# Patient Record
Sex: Male | Born: 1935 | Race: White | Hispanic: No | Marital: Married | Smoking: Former smoker
Health system: Southern US, Community
[De-identification: ages and names within clinical notes are randomized; demographics above are authoritative.]

## PROBLEM LIST (undated history)

## (undated) DIAGNOSIS — I2510 Atherosclerotic heart disease of native coronary artery without angina pectoris: Secondary | ICD-10-CM

## (undated) DIAGNOSIS — E785 Hyperlipidemia, unspecified: Secondary | ICD-10-CM

## (undated) DIAGNOSIS — K219 Gastro-esophageal reflux disease without esophagitis: Secondary | ICD-10-CM

## (undated) DIAGNOSIS — M545 Low back pain, unspecified: Secondary | ICD-10-CM

## (undated) DIAGNOSIS — R0600 Dyspnea, unspecified: Secondary | ICD-10-CM

## (undated) DIAGNOSIS — I10 Essential (primary) hypertension: Secondary | ICD-10-CM

## (undated) DIAGNOSIS — G4730 Sleep apnea, unspecified: Secondary | ICD-10-CM

## (undated) DIAGNOSIS — M1990 Unspecified osteoarthritis, unspecified site: Secondary | ICD-10-CM

## (undated) DIAGNOSIS — N400 Enlarged prostate without lower urinary tract symptoms: Secondary | ICD-10-CM

## (undated) HISTORY — DX: Benign prostatic hyperplasia without lower urinary tract symptoms: N40.0

## (undated) HISTORY — PX: APPENDECTOMY: SHX54

## (undated) HISTORY — PX: CARDIAC CATHETERIZATION: SHX172

## (undated) HISTORY — DX: Hyperlipidemia, unspecified: E78.5

## (undated) HISTORY — PX: CERVICAL FUSION: SHX112

## (undated) HISTORY — PX: TOTAL KNEE ARTHROPLASTY: SHX125

## (undated) HISTORY — PX: OTHER SURGICAL HISTORY: SHX169

## (undated) HISTORY — DX: Low back pain, unspecified: M54.50

## (undated) HISTORY — DX: Gastro-esophageal reflux disease without esophagitis: K21.9

## (undated) HISTORY — PX: TONSILLECTOMY: SUR1361

## (undated) HISTORY — PX: COLONOSCOPY: SHX174

## (undated) HISTORY — PX: NECK SURGERY: SHX720

## (undated) HISTORY — PX: EYE SURGERY: SHX253

---

## 1991-03-03 HISTORY — PX: BACK SURGERY: SHX140

## 2000-12-13 ENCOUNTER — Encounter: Payer: Self-pay | Admitting: Neurosurgery

## 2000-12-13 ENCOUNTER — Ambulatory Visit (HOSPITAL_COMMUNITY): Admission: RE | Admit: 2000-12-13 | Discharge: 2000-12-13 | Payer: Self-pay | Admitting: Neurosurgery

## 2001-02-10 ENCOUNTER — Encounter: Payer: Self-pay | Admitting: Neurosurgery

## 2001-02-10 ENCOUNTER — Ambulatory Visit (HOSPITAL_COMMUNITY): Admission: RE | Admit: 2001-02-10 | Discharge: 2001-02-10 | Payer: Self-pay | Admitting: Neurosurgery

## 2002-01-02 ENCOUNTER — Emergency Department (HOSPITAL_COMMUNITY): Admission: EM | Admit: 2002-01-02 | Discharge: 2002-01-02 | Payer: Self-pay | Admitting: Emergency Medicine

## 2004-06-19 ENCOUNTER — Ambulatory Visit (HOSPITAL_COMMUNITY): Admission: RE | Admit: 2004-06-19 | Discharge: 2004-06-19 | Payer: Self-pay | Admitting: Orthopedic Surgery

## 2004-06-25 ENCOUNTER — Ambulatory Visit: Payer: Self-pay | Admitting: Cardiology

## 2004-06-30 ENCOUNTER — Ambulatory Visit: Payer: Self-pay

## 2004-07-09 ENCOUNTER — Inpatient Hospital Stay (HOSPITAL_COMMUNITY): Admission: RE | Admit: 2004-07-09 | Discharge: 2004-07-13 | Payer: Self-pay | Admitting: Orthopedic Surgery

## 2004-07-23 ENCOUNTER — Emergency Department (HOSPITAL_COMMUNITY): Admission: EM | Admit: 2004-07-23 | Discharge: 2004-07-24 | Payer: Self-pay | Admitting: Emergency Medicine

## 2005-02-12 ENCOUNTER — Ambulatory Visit: Payer: Self-pay | Admitting: *Deleted

## 2005-04-08 ENCOUNTER — Inpatient Hospital Stay (HOSPITAL_COMMUNITY): Admission: RE | Admit: 2005-04-08 | Discharge: 2005-04-11 | Payer: Self-pay | Admitting: Orthopedic Surgery

## 2006-01-20 ENCOUNTER — Emergency Department (HOSPITAL_COMMUNITY): Admission: EM | Admit: 2006-01-20 | Discharge: 2006-01-20 | Payer: Self-pay | Admitting: Emergency Medicine

## 2008-11-10 ENCOUNTER — Encounter: Admission: RE | Admit: 2008-11-10 | Discharge: 2008-11-10 | Payer: Self-pay | Admitting: Neurosurgery

## 2008-12-07 ENCOUNTER — Inpatient Hospital Stay (HOSPITAL_COMMUNITY): Admission: RE | Admit: 2008-12-07 | Discharge: 2008-12-08 | Payer: Self-pay | Admitting: Neurosurgery

## 2010-06-05 LAB — CBC
HCT: 42.8 % (ref 39.0–52.0)
MCHC: 33.9 g/dL (ref 30.0–36.0)
MCV: 94.1 fL (ref 78.0–100.0)
Platelets: 184 10*3/uL (ref 150–400)
RDW: 13.6 % (ref 11.5–15.5)
WBC: 5 10*3/uL (ref 4.0–10.5)

## 2010-06-05 LAB — BASIC METABOLIC PANEL
BUN: 19 mg/dL (ref 6–23)
CO2: 31 mEq/L (ref 19–32)
Chloride: 103 mEq/L (ref 96–112)
Glucose, Bld: 98 mg/dL (ref 70–99)
Potassium: 3.8 mEq/L (ref 3.5–5.1)

## 2010-07-18 NOTE — Op Note (Signed)
Justin Coleman, Justin Coleman              ACCOUNT NO.:  [id number]   MEDICAL RECORD NO.:  [id number]          PATIENT TYPE:  INP   LOCATION:  2550                         FACILITY:  [facility]   PHYSICIAN:  Justin Coleman, M.D. DATE OF BIRTH:  07-08-1932   DATE OF PROCEDURE:  07/09/2004  DATE OF DISCHARGE:                                 OPERATIVE REPORT   PREOPERATIVE DIAGNOSIS:  Right knee DJD.   POSTOPERATIVE DIAGNOSIS:  Same.   PROCEDURE:  Right total knee replacement using DePuy cemented total knee  system with #5 cemented femur, #5 cemented tibia with 10 mm polyethylene RP  tibial spacer and 32 mm polyethylene cemented patella.   SURGEON:  Justin Coleman.   M.D.ASSISTANT:  Justin Coleman, P.A.   ANESTHESIA:  General.   OPERATIVE TIME:  1 hour 25 minutes.   COMPLICATIONS:  None.   DESCRIPTION OF PROCEDURE:  [name] was brought to operating room on  07/09/2004 after a femoral nerve block had been placed in the holding room  by anesthesia. He is placed on the operative table in supine position. After  being placed under general anesthesia, he received Ancef 1 gram IV  preoperatively for prophylaxis.  He had a Foley catheter placed under  sterile conditions. His right knee was examined under anesthesia. Range of  motion from -10 to 125 degrees with mild varus deformity.  Knee stable to  ligamentous exam with normal patellar tracking. Right leg was prepped using  sterile DuraPrep and draped using sterile technique. Leg was exsanguinated  and a thigh tourniquet elevated 375 mm. Initially, through a 15 cm  longitudinal incision based over the patella, initial exposure was made.  The underlying subcutaneous tissues were incised in line with the skin  incision. A median arthrotomy was performed revealing an excessive amount of  normal-appearing joint fluid. The articular surfaces were inspected. He had  grade 4 changes medially, grade 3 changes laterally, and grade 3 and 4  changes in the patellofemoral joint. Osteophytes were removed from the  femoral condyles and tibial plateau. The medial and lateral meniscal  remnants were removed as well as the anterior cruciate ligament. An  intramedullary drill was then drilled up the femoral canal for placement of  the distal femoral cutting jig which was placed in the appropriate amount of  rotation and the distal 11 mm cut was made. The distal femur was then sized.  A #5 was found be the appropriate size. The #5 cutting jig was placed and  then these cuts were made. After this was done the proximal tibia was  exposed. The tibial spines were removed with an oscillating saw.  Intramedullary drill was then drilled down the tibial canal for placement of  proximal tibial cutting jig which was placed in the appropriate amount of  rotation.  Then the proximal 6 mm cut was made based off the medial or lower  side. After this was done, then the spacer blocks were placed both in  flexion and extension and with 10 mm blocks and this was found to give  excellent balance. The PCL cutter was  then placed on the distal femur and  then this cut was made. After this was done, then the #5 tibial base plate  was placed and the keel cut was made. At this point the femoral trial was  placed. The tibial base plate trial was placed and with a 10 mm polyethylene  RP tibial spacer, there was found to be excellent restoration of normal  alignment, excellent stability, range of motion from 0 to 125 degrees. The  patella was then sized. A 32 mm was found to be the appropriate size. A  resurfacing cut, resecting 10 mm of patella was obtained with a cutting jig  and then three locking holes were placed. The patellar trial was then placed  and the patellofemoral tracking was evaluated. This was found to be normal.  At this point was felt that all the trial components were of excellent size  and stability. They were then removed.  The knee was then  jet lavage  irrigated with 3 liters of saline solution. The proximal tibia was exposed  and the #5 tibial baseplate with cement backing was hammered into position  with an excellent fit with excess cement being removed from around the  edges. The #5 femoral component with cement backing was hammered into  position also with an excellent fit with excess cement being removed from  around the edges. A 10 mm polyethylene RP tibial spacer was then placed on  the tibial base plate. The knee taken through range of motion from 0 to 120  degrees with excellent stability, excellent correction of his varus  deformity. The 32 mm resurfacing patella with cement backing was then placed  into its three locking holes, and held there with a clamp. After the cement  hardened. Patellofemoral tracking was evaluated. This was found be normal.  At this point felt that all components were of excellent size and stability.  The knee was further irrigated with saline and then the arthrotomy was  closed with #1 Ethibond suture over two medium Hemovac drains. Subcutaneous  tissues closed with 0 and 2-0 Vicryl.  Prior to this, the tourniquet was  released and hemostasis was obtained with cautery. The subcutaneous tissues  were then closed with 2-0 Vicryl and subcuticular layer closed with 3-0  Monocryl. Steri-Strips were applied. Sterile dressings were applied. A  Hemovac injected with 0.25%  Marcaine with epinephrine, 4 milligrams  morphine and clamped and then a long-leg splint applied. The patient  awakened, extubated, taken to recovery in stable condition. Needle and  sponge count was correct x 2 at the end of the case.       RAW/MEDQ  D:  07/09/2004  T:  07/09/2004  Job:  [id number]

## 2010-07-18 NOTE — Discharge Summary (Signed)
Justin Coleman, Justin Coleman              ACCOUNT NO.:  [id number]   MEDICAL RECORD NO.:  [id number]          PATIENT TYPE:  INP   LOCATION:  5038                         FACILITY:  [facility]   PHYSICIAN:  Robert A. Thurston Hole, M.D. DATE OF BIRTH:  1932/08/14   DATE OF ADMISSION:  04/08/2005  DATE OF DISCHARGE:  04/11/2005                                 DISCHARGE SUMMARY   ADMISSION DIAGNOSES:  1.  End-stage degenerative joint disease, left knee.  2.  Hypertension.  3.  Benign prostatic hypertrophy.   DISCHARGE DIAGNOSES:  1.  End-stage degenerative joint disease, left knee.  Status post total knee      replacement.  2.  Hypertension.  3.  Benign prostatic hypertrophy.   HISTORY OF PRESENT ILLNESS:  The patient is a 76 year old white male with a  history of end-stage DJD of his left knee.  He is one-year status post to  his right total knee replacement, and did very well with this.  He now has  pain in his left knee with walking and with any activity; that has failed  conservative care including anti-inflammatories, cortisone injection and  rest.  He understands the risks, benefits and possible complications of the  left total knee replacement and is without questions.   </   HOSPITAL COURSE:  On April 08, 2005 the patient underwent a left total  knee replacement by Dr. Thurston Hole.  He tolerated the procedure well.  Postoperatively he received a femoral nerve block, which he also tolerated  well.  He was admitted for pain control, DVT prophylaxis and physical  therapy.   On postoperative day 1 the patient continued to do well without complaint.  Hemoglobin was 12.2.  He was slightly hypokalemic and hyponatremic, with a  potassium of 3.2 and sodium of 133.  He was metabolically stable otherwise.  His Foley catheter was discontinued.  His PCA was discontinued.  His IV was  HepLok'd.  He was given Milk of Magnesia.  His dressing was changed and  given thigh-high TED hose.   On postoperative  day #2 the patient continued to do well.  He did have a  slight productive cough, but continues to progress well with physical  therapy.  The temperature max was 101.  Temperature on examination was 97.3,  pulse 100, blood pressure 136/71.  The surgical wound is well approximated.  His lungs are clear.  His calf is soft and he is neurovascularly intact.  His slight fever is most likely due to atelectasis. We encouraged incentive  spirometry; but, if it spikes again will obtain blood cultures, UA, culture  and sensitivity and a chest x-ray.   Discharge planning was made for the next day, postoperative day #3.  The  patient had significant difficulty with diarrhea.  Temperature max was 99.3.  Hemoglobin was 10.1.  The surgical wound was well approximated.  He was  discharged to home in stable condition.  Weightbearing as tolerated.  On a  regular diet.   DISCHARGE MEDICATIONS:  1.  Hydrochlorothiazide 50 mg 1/2 tablet daily.  2.  FloMax 0.4 mg at night.  3.  Hydrocodone 1-2 tablets q.4 h. as needed for pain.  4.  Robaxin 500 mg one q.6 h. p.r.n. muscle spasm.  5.  Lovenox 30 mg subcutaneous.  6.  Coumadin per pharmacy protocol.  7.  Colace 100 mg twice daily as needed for constipation.   SPECIAL INSTRUCTIONS:  His next INR is scheduled for Monday, April 13, 2005.  He will follow-up with Dr. Thurston Hole on April 21, 2005.  He has been  instructed to call with increased pain, increased redness, increased  swelling or temperature greater than 101.      Kirstin Shepperson, P.A.      Robert A. Thurston Hole, M.D.  Electronically Signed    KS/MEDQ  D:  06/19/2005  T:  06/22/2005  Job:  [id number]

## 2010-07-18 NOTE — Discharge Summary (Signed)
Justin Coleman, Justin Coleman              ACCOUNT NO.:  [id number]   MEDICAL RECORD NO.:  [id number]          PATIENT TYPE:  INP   LOCATION:  5016                         FACILITY:  [facility]   PHYSICIAN:  Elana Alm. Thurston Hole, M.D. DATE OF BIRTH:  March 28, 1932   DATE OF ADMISSION:  07/09/2004  DATE OF DISCHARGE:  07/13/2004                                 DISCHARGE SUMMARY   ADMISSION DIAGNOSES:  1.  End-stage degenerative joint disease right knee.  2.  Hypertension.   DISCHARGE DIAGNOSES:  1.  End-stage degenerative joint disease right knee.  2.  Hypertension.  3.  Obesity.  4.  Gastroesophageal reflux disease.  5.  Hypokalemia.   HISTORY OF PRESENT ILLNESS:  The patient is a 77 year old white male with a  history of end-stage DJD of his right knee.  He has failed conservative care  including antiinflammatories and physical therapy.  He understands risks,  benefits and possible complications of a right total knee replacement and is  without questions.   HOSPITAL COURSE:  On Jul 09, 2004 the patient underwent right total knee  replacement by Dr. Thurston Hole and a femoral nerve block by anesthesia.  He  tolerated the procedure well.  Postoperatively he was admitted for pain  control, DVT prophylaxis and physical therapy.  Postoperative day one the  patient was doing well.  He had some difficulty with pain.  T-max of 100.6,  hemoglobin 12.6, INR 1.1.  His surgical wound was well-approximated.  His  PCA was discontinued.  His dressing was changed.  TED hose were placed  following the dressing change.  The patient ambulated five feet with  physical therapy.  Postoperative day the patient had difficulty with nausea.  His hemoglobin was 11.2.  He did have positive bowel sounds.  His abdomen  was slightly distended and nontender.  His surgical wound was well-  approximated.  He was given a Dulcolax suppository which did have some  results.  Postoperative day three the patient had difficulty with  nausea.  He was afebrile.  His hemoglobin was 10.1.  Abdomen was soft, nontender and  did have positive bowel sounds.  Postoperative day four the patient was  significantly improved.  Range of motion -5-60 degrees.  He did stairs with  physical therapy.   DISPOSITION:  He was discharged to home in stable condition.  Weightbearing  as tolerated.  On a regular diet.   DISCHARGE MEDICATIONS:  1.  Percocet 5/325 1-2 q.4-6h. p.r.n. pain.  2.  Robaxin 500 mg one q.6-8h. p.r.n. muscle spasm.  3.  Reglan 10 mg one tablet every eight hours.  4.  Coumadin 5 mg daily.  5.  Aspirin 81 mg daily.  6.  Hydrochlorothiazide 25 mg daily.  7.  Flomax 0.4 mg daily.  8.  Do not take Celebrex.  9.  Colace 100 mg one tablet twice a day.  10. Senokot-S-S two tablets before dinner.   DISPOSITION:  CPM 0-90 degrees eight hours a day.  Elevate his right heel on  a folded pillow 30 minutes every morning.  Call with increased redness,  increased drainage, increased  swelling or temperature greater than 101.  Followup appointment is August 01, 2004 with Dr. Thurston Hole.       KS/MEDQ  D:  09/22/2004  T:  09/22/2004  Job:  [id number]

## 2010-07-18 NOTE — Op Note (Signed)
Justin Coleman, Justin Coleman              ACCOUNT NO.:  [id number]   MEDICAL RECORD NO.:  [id number]          PATIENT TYPE:  INP   LOCATION:  2550                         FACILITY:  [facility]   PHYSICIAN:  Robert A. Thurston Hole, M.D. DATE OF BIRTH:  05-20-1932   DATE OF PROCEDURE:  04/08/2005  DATE OF DISCHARGE:                                 OPERATIVE REPORT   PREOPERATIVE DIAGNOSIS:  Left knee degenerative joint disease.   POSTOPERATIVE DIAGNOSIS:  Left knee degenerative joint disease.   PROCEDURE:  Left total knee replacement using DePuy cemented total knee  system with #4 cemented femur, #5 cemented tibia with 15-mm polyethylene RP  tibial spacer, and a 38-mm polyethylene cemented patella.   SURGEON:  Elana Alm. Thurston Hole, M.D.   ASSISTANT:  Julien Girt, P.A.   ANESTHESIA:  General.   OPERATIVE TIME:  1 hour 15 minutes.   COMPLICATIONS:  None.   DESCRIPTION OF PROCEDURE:  Mr. Cruey was brought to the operating room on  04/08/2005 after a femoral nerve block was placed in the holding area by  anesthesia. He was placed on the operative table in the supine position. He  received Ancef 2 grams IV preoperatively for prophylaxis. After being placed  under general anesthesia, his left knee was examined. He had range of motion  from minus 5 to 125-degrees with mild varus deformity.  Knee stable  ligamentous exam with normal patellar tracking. He had a Foley catheter  placed under sterile conditions. His left leg was then prepped using sterile  DuraPrep and draped using sterile technique. Leg was exsanguinated and a  thigh tourniquet elevated to 365 mm. Initially through a 15-cm longitudinal  incision, based over the patella, initial exposure was made. The underlying  subcutaneous tissues were incised along with the skin incision. A median  arthrotomy was performed revealing an excessive amount of normal-appearing  joint fluid. The articular surfaces were inspected. He had grade 4  changes  medially, grade 3 changes laterally, and grade 3 and 4 changes in the  patellofemoral joint.   Medial and lateral meniscal remnants were removed as well as osteophytes off  the femoral condyles and tibial plateau. An intramedullary drill was then  drilled up the femoral canal for placement of the distal femoral cutting jig  which was placed in the appropriate amount of rotation and the distal 11-mm  cut was made. The distal femur was incised; #4 was found to be the  appropriate size. A #4 cutting jig was placed, and then these cuts were  made. The proximal tibia was then exposed. The tibial spines were removed  with an oscillating saw. Intramedullary drill was drilled down the tibial  canal for placement of the proximal tibial cutting jig which was placed in  the appropriate amount of rotation; and an 8-mm proximal cut was made based  off the medial or lower side.   Spacer blocks were then placed in flexion and extension; 15-mm blocks gave  excellent balancing in flexion and excellent balancing; and stability in  both flexion and extension; and excellent correction of his flexion and  varus deformities. The  proximal tibia was then exposed and a #5 tibial base  plate trial was placed and the keel cut was made. The PCL box cutter was  then placed on the distal femur and these cuts were made. At this point the  #4 femoral trial was placed with the #5 tibial base plate trial, and a 15-mm  polyethylene RP tibial spacer. The knee was reduced, taken through range of  motion 0-125 degrees with excellent stability and excellent correction of  his flexion and varus deformities. The patella was incised. A resurfacing 10-  mm cut was made and 3 locking holes placed for a 38-mm patella. The trial  was placed. Patellofemoral tracking was then evaluated; and this was found  to be normal. At this point it was felt that all the trial components were  of excellent size fit and stability. They  were then removed and the knee was  jet lavage irrigated with 3 liters of saline solution. The proximal tibia  was then exposed; and a #5 tibial baseplate with cement backing was hammered  into position with an excellent fit with excess cement being removed from  around the edges. The #4 femoral component with cement backing was hammered  into position, also with an excellent fit with excess cement being removed  from around the edges. The 15-mm polyethylene RP tibial spacer was placed on  the tibial baseplate. The knee reduced, taken through a range of motion, 0-  125 degrees with excellent stability, and excellent correction of his  flexion and varus deformities.   The 38-mm polyethylene cement backed patella was then placed in its position  and held there with a clamp. After the cement had hardened, patellofemoral  tracking was evaluated and this was found to be normal. At this point it was  felt that all the components were of excellent size and stability. The wound  was further irrigated with saline. The tourniquet was then released.  Hemostasis obtained with cautery. The arthrotomy was then closed with #1  Ethibond suture over 2 medium Hemovac drains. Subcutaneous tissues closed  with 0 and 2-0 Vicryl; subcuticular layer closed with 4-0 Monocryl. Steri-  Strips were applied. Sterile dressings were applied. The Hemovac injected  with 0.2% Marcaine with epinephrine and 4 mg of morphine and clamped. The  patient was then awakened, extubated, and taken to recovery room in stable  condition. Needle and sponge counts correct x2 at the end of the case.      Robert A. Thurston Hole, M.D.  Electronically Signed     RAW/MEDQ  D:  04/08/2005  T:  04/08/2005  Job:  [id number]

## 2010-07-21 ENCOUNTER — Other Ambulatory Visit: Payer: Self-pay | Admitting: Neurosurgery

## 2010-07-21 DIAGNOSIS — M549 Dorsalgia, unspecified: Secondary | ICD-10-CM

## 2010-07-22 ENCOUNTER — Ambulatory Visit
Admission: RE | Admit: 2010-07-22 | Discharge: 2010-07-22 | Disposition: A | Discharge status: Home / Self Care | Payer: Medicare Other | Source: Ambulatory Visit | Attending: Neurosurgery | Admitting: Neurosurgery

## 2010-07-22 DIAGNOSIS — M549 Dorsalgia, unspecified: Secondary | ICD-10-CM

## 2012-06-09 ENCOUNTER — Other Ambulatory Visit: Payer: Self-pay | Admitting: Cardiology

## 2012-06-09 ENCOUNTER — Ambulatory Visit
Admission: RE | Admit: 2012-06-09 | Discharge: 2012-06-09 | Disposition: A | Discharge status: Home / Self Care | Payer: Medicare Other | Source: Ambulatory Visit | Attending: Cardiology | Admitting: Cardiology

## 2012-06-09 DIAGNOSIS — R079 Chest pain, unspecified: Secondary | ICD-10-CM

## 2014-02-26 ENCOUNTER — Emergency Department (HOSPITAL_COMMUNITY): Payer: Medicare Other

## 2014-02-26 ENCOUNTER — Encounter (HOSPITAL_COMMUNITY): Payer: Self-pay | Admitting: Nurse Practitioner

## 2014-02-26 ENCOUNTER — Emergency Department (HOSPITAL_COMMUNITY)
Admission: EM | Admit: 2014-02-26 | Discharge: 2014-02-26 | Disposition: A | Discharge status: Home / Self Care | Payer: Medicare Other | Attending: Emergency Medicine | Admitting: Emergency Medicine

## 2014-02-26 DIAGNOSIS — Y9289 Other specified places as the place of occurrence of the external cause: Secondary | ICD-10-CM | POA: Insufficient documentation

## 2014-02-26 DIAGNOSIS — W19XXXA Unspecified fall, initial encounter: Secondary | ICD-10-CM

## 2014-02-26 DIAGNOSIS — Z87891 Personal history of nicotine dependence: Secondary | ICD-10-CM | POA: Diagnosis not present

## 2014-02-26 DIAGNOSIS — W01198A Fall on same level from slipping, tripping and stumbling with subsequent striking against other object, initial encounter: Secondary | ICD-10-CM | POA: Diagnosis not present

## 2014-02-26 DIAGNOSIS — Y998 Other external cause status: Secondary | ICD-10-CM | POA: Diagnosis not present

## 2014-02-26 DIAGNOSIS — I10 Essential (primary) hypertension: Secondary | ICD-10-CM | POA: Insufficient documentation

## 2014-02-26 DIAGNOSIS — Y9301 Activity, walking, marching and hiking: Secondary | ICD-10-CM | POA: Insufficient documentation

## 2014-02-26 DIAGNOSIS — S29092A Other injury of muscle and tendon of back wall of thorax, initial encounter: Secondary | ICD-10-CM | POA: Insufficient documentation

## 2014-02-26 DIAGNOSIS — S0990XA Unspecified injury of head, initial encounter: Secondary | ICD-10-CM | POA: Insufficient documentation

## 2014-02-26 HISTORY — DX: Essential (primary) hypertension: I10

## 2014-02-26 NOTE — ED Provider Notes (Signed)
CSN: [id number]     Arrival date & time 02/26/14  1919 History   First MD Initiated Contact with Patient 02/26/14 1937     Chief Complaint  Patient presents with  . Fall     (Consider location/radiation/quality/duration/timing/severity/associated sxs/prior Treatment) Patient is a 80 y.o. male presenting with fall. The history is provided by the patient.  Fall This is a new problem. The current episode started 3 to 5 hours ago. Episode frequency: once. The problem has not changed since onset.Pertinent negatives include no chest pain, no abdominal pain and no shortness of breath. Nothing aggravates the symptoms. Nothing relieves the symptoms.    Past Medical History  Diagnosis Date  . Hypertension    Past Surgical History  Procedure Laterality Date  . Neck surgery    . Total knee arthroplasty Bilateral 2006/2007  . Appendectomy    . Tonsillectomy     History reviewed. No pertinent family history. History  Substance Use Topics  . Smoking status: Former Games developermoker  . Smokeless tobacco: Not on file  . Alcohol Use: No    Review of Systems  Constitutional: Negative for fever.  Respiratory: Negative for cough and shortness of breath.   Cardiovascular: Negative for chest pain and leg swelling.  Gastrointestinal: Negative for vomiting and abdominal pain.  All other systems reviewed and are negative.     Allergies  Review of patient's allergies indicates no known allergies.  Home Medications   Prior to Admission medications   Not on File   BP 122/62 mmHg  Pulse 83  Temp(Src) 98.2 F (36.8 C) (Oral)  Resp 16  SpO2 92% Physical Exam  Constitutional: He is oriented to person, place, and time. He appears well-developed and well-nourished. No distress.  HENT:  Head: Normocephalic and atraumatic.  Mouth/Throat: Oropharynx is clear and moist. No oropharyngeal exudate.  Eyes: EOM are normal. Pupils are equal, round, and reactive to light.  Neck: Normal range of motion. Neck  supple.  Cardiovascular: Normal rate and regular rhythm.  Exam reveals no friction rub.   No murmur heard. Pulmonary/Chest: Effort normal and breath sounds normal. No respiratory distress. He has no wheezes. He has no rales.  Abdominal: Soft. He exhibits no distension. There is no tenderness. There is no rebound.  Musculoskeletal: Normal range of motion. He exhibits no edema.       Cervical back: He exhibits no tenderness and no bony tenderness.       Thoracic back: He exhibits tenderness (lower thoracic, bilateral musculature). He exhibits no bony tenderness.       Lumbar back: He exhibits no tenderness and no bony tenderness.  Neurological: He is alert and oriented to person, place, and time. No cranial nerve deficit. He exhibits normal muscle tone. Coordination normal.  Skin: No rash noted. He is not diaphoretic.  Nursing note and vitals reviewed.   ED Course  Procedures (including critical care time) Labs Review Labs Reviewed - No data to display  Imaging Review Dg Chest 2 View  02/26/2014   CLINICAL DATA:  Left-sided chest pain  EXAM: CHEST  2 VIEW  COMPARISON:  June 09, 2012  FINDINGS: There is mild right upper lobe atelectatic change. Elsewhere lungs are clear. Heart is upper normal in size with pulmonary vascularity within normal limits. No adenopathy. No pneumothorax. There is degenerative change in the thoracic spine. There is postoperative change in the lower cervical region.  IMPRESSION: Atelectasis right upper lobe. No edema or consolidation. No pneumothorax.   Electronically Signed  By: Bretta BangWilliam  Woodruff M.D.   On: 02/26/2014 20:44   Ct Head Wo Contrast  02/26/2014   CLINICAL DATA:  Slip and fall tonight, hit back of head on concrete garage floor, no loss of consciousness.  EXAM: CT HEAD WITHOUT CONTRAST  TECHNIQUE: Contiguous axial images were obtained from the base of the skull through the vertex without intravenous contrast.  COMPARISON:  None.  FINDINGS: The ventricles  and sulci are normal for age. No intraparenchymal hemorrhage, mass effect nor midline shift. Patchy supratentorial white matter hypodensities are within normal range for patient's age and though non-specific suggest sequelae of chronic small vessel ischemic disease. No acute large vascular territory infarcts.  No abnormal extra-axial fluid collections. Basal cisterns are patent. Mild calcific atherosclerosis of the carotid siphons.  Small LEFT parietal scalp hematoma, no subcutaneous gas radiopaque foreign bodies. No skull fracture. The included ocular globes and orbital contents are non-suspicious. Status post bilateral ocular lens implants. Paranasal sinus mucosal thickening, the visualized paranasal sinus air-fluid levels. Trace LEFT mastoid effusion.  IMPRESSION: Small LEFT parietal scalp hematoma. No skull fracture nor acute intracranial process.  Normal noncontrast CT of the head for age.   Electronically Signed   By: Awilda Metroourtnay  Bloomer   On: 02/26/2014 20:48     EKG Interpretation   Date/Time:  Monday February 26 2014 20:52:11 EST Ventricular Rate:  77 PR Interval:  193 QRS Duration: 90 QT Interval:  366 QTC Calculation: 414 R Axis:   2 Text Interpretation:  Sinus rhythm Left atrial enlargement Low voltage,  precordial leads No significant change since last tracing Confirmed by  Gwendolyn GrantWALDEN  MD, Addalynne Golding ([id number]) on 02/27/2014 1:07:39 AM      MDM   Final diagnoses:  Fall    34M fell while walking into the house. Lost his balance, no preceding CP, SOB, dizziness. Fell onto his buttocks then hit his head on the car. No LOC. Is not on blood thinners. Of note, is on amoxicillin for an ear infection and has been taking cough syrup. Took pain meds at home, is feeling well, but still having pain. Having posterior back pain bilaterally in thoracic musculature. AFVSS here. No abdominal pain. No midline spinal pain. No spasms noted. No abdominal pain. No upper or lower extremity injury noted. Will scan  his head, check EKG, xray his chest. Imaging normal. Has vicodin at home. Ambulated well. Stable for discharge.   Elwin MochaBlair Azyria Osmon, MD 02/27/14 [id number]

## 2014-02-26 NOTE — ED Notes (Signed)
Patient transported to X-ray 

## 2014-02-26 NOTE — Progress Notes (Signed)
CSW met with patient at bedside. Family was present at bedside. According to patient he fell while at home in his garage today. Patient states he lost his balance and fell backwards unto the concrete floor. Patient says that he has a knot on his head. Patient informed CSW that prior to coming into the ED today, he has been able to complete ADL's independently.   Patient states that he lives in [place] with his wife.  Wife/Betty Antonia Culbertson [phone]  Willette Brace [phone] ED CSW 02/26/2014 9:36 PM

## 2014-02-26 NOTE — ED Notes (Signed)
Patient transported to CT 

## 2014-02-26 NOTE — ED Notes (Addendum)
Per EMS, pt from home, reports pt slipped on his steps and fell backwards hitting the back of his head on a brick ground.  Hematoma in back of his head.  Denies LOC.  Pt is A&Ox 4.  Pt also reports L side pain.  No bruising noted per EMS.

## 2014-02-26 NOTE — ED Notes (Signed)
Bed: [facility] Expected date:  Expected time:  Means of arrival:  Comments: EMS-fall 

## 2014-02-26 NOTE — Discharge Instructions (Signed)

## 2014-07-24 ENCOUNTER — Other Ambulatory Visit: Payer: Self-pay | Admitting: Family Medicine

## 2014-07-24 DIAGNOSIS — R519 Headache, unspecified: Secondary | ICD-10-CM

## 2014-07-24 DIAGNOSIS — H9190 Unspecified hearing loss, unspecified ear: Secondary | ICD-10-CM

## 2014-07-24 DIAGNOSIS — R51 Headache: Secondary | ICD-10-CM

## 2014-07-24 DIAGNOSIS — R4182 Altered mental status, unspecified: Secondary | ICD-10-CM

## 2014-08-07 ENCOUNTER — Other Ambulatory Visit: Payer: Self-pay

## 2015-07-18 ENCOUNTER — Other Ambulatory Visit: Payer: Self-pay | Admitting: Family Medicine

## 2015-07-18 DIAGNOSIS — R569 Unspecified convulsions: Secondary | ICD-10-CM

## 2015-07-26 ENCOUNTER — Ambulatory Visit
Admission: RE | Admit: 2015-07-26 | Discharge: 2015-07-26 | Disposition: A | Discharge status: Home / Self Care | Payer: Self-pay | Source: Ambulatory Visit | Attending: Family Medicine | Admitting: Family Medicine

## 2015-07-26 DIAGNOSIS — R569 Unspecified convulsions: Secondary | ICD-10-CM

## 2016-01-28 IMAGING — CR DG CHEST 2V
2 series · 2 of 2 positions shown · non-contrast
Comparison: June 09, 2012

CLINICAL DATA: Left-sided chest pain

EXAM:
CHEST  2 VIEW

[w chest lat]
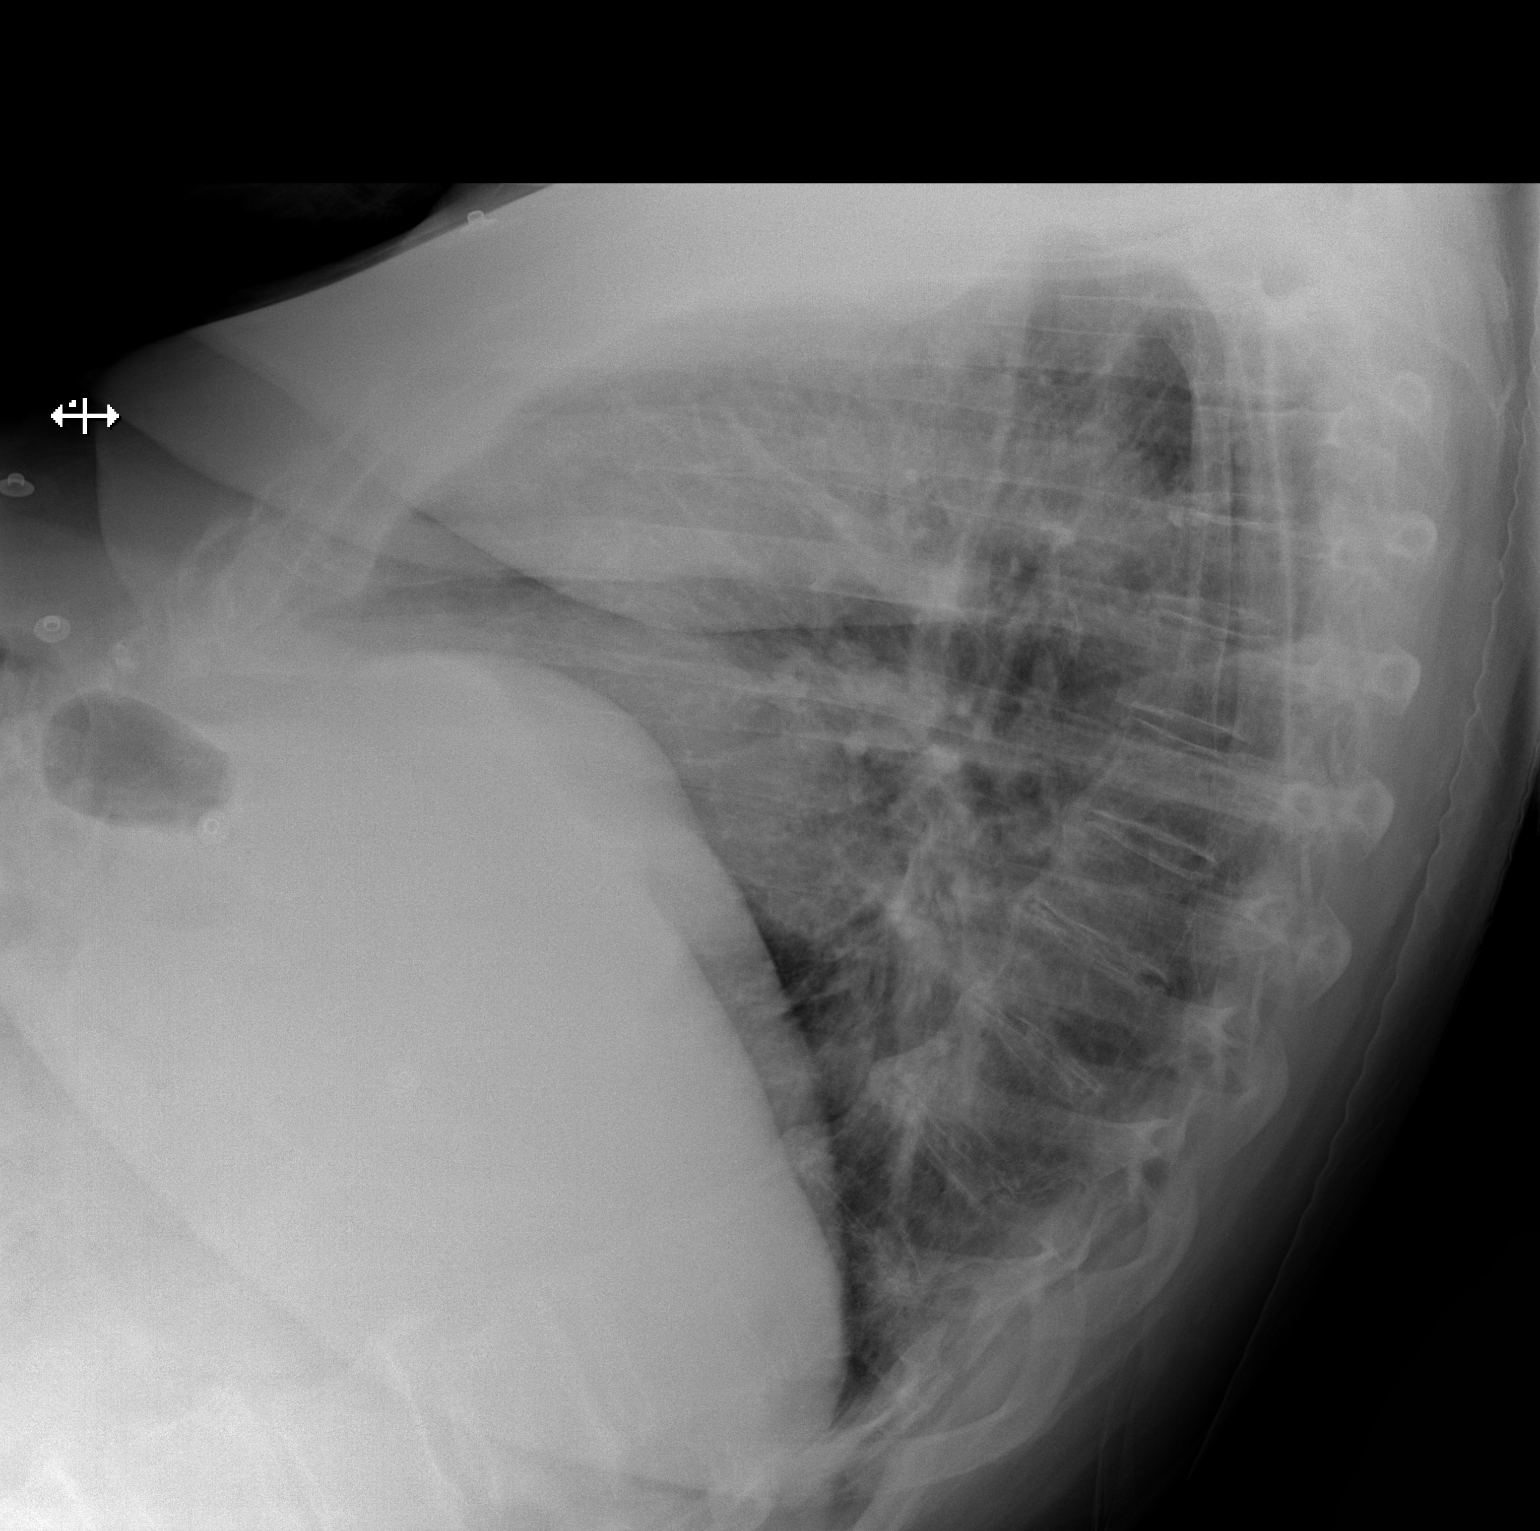

[x chest ap]
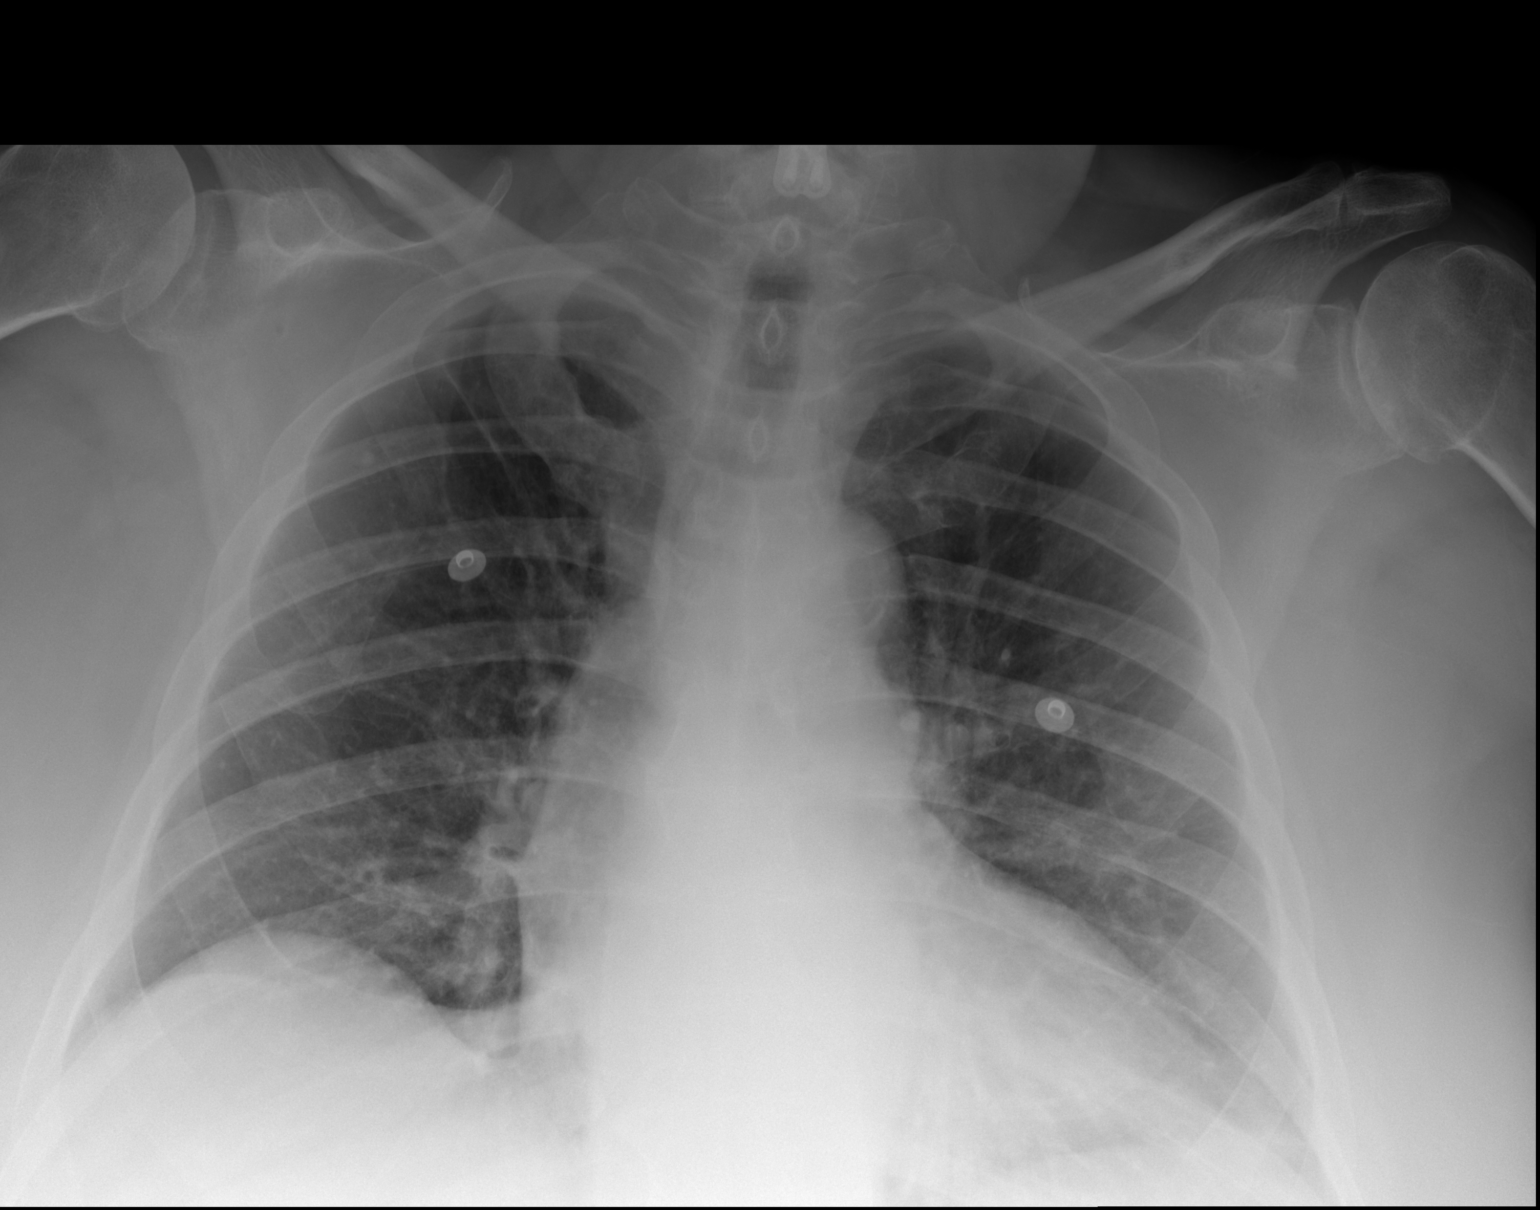

[2 of 2 positions shown; findings below may reference images not displayed]

FINDINGS: There is mild right upper lobe atelectatic change. Elsewhere lungs
are clear. Heart is upper normal in size with pulmonary vascularity
within normal limits. No adenopathy. No pneumothorax. There is
degenerative change in the thoracic spine. There is postoperative
change in the lower cervical region.
IMPRESSION: Atelectasis right upper lobe. No edema or consolidation. No
pneumothorax.

## 2017-06-26 IMAGING — MR MR HEAD W/O CM
11 series · 47 of 48 positions shown · non-contrast
Comparison: Head CT 02/26/2014

CLINICAL DATA: Seizure. Secondary hypogonadism. Weakness and lack
of energy for 3 months.

EXAM:
MRI HEAD WITHOUT CONTRAST
TECHNIQUE: Multiplanar, multiecho pulse sequences of the brain and surrounding
structures were obtained without intravenous contrast.

[Series 2: T1 · sagittal · 5.0mm · 0.49mm/px · 2 of 21 slices shown]
[im 1/21]
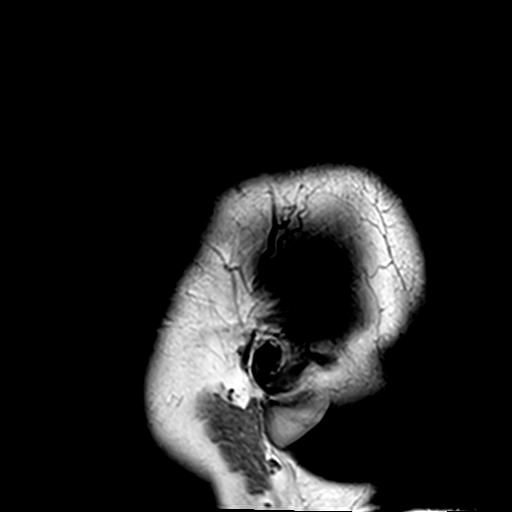
[im 21/21]
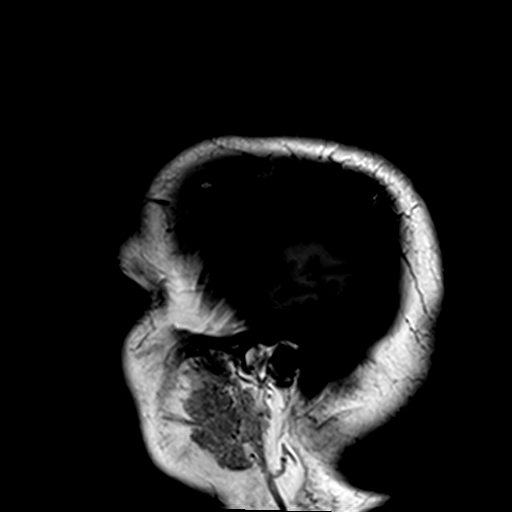

[Series 3: DWI · axial · 3.0mm · 1.88mm/px · z∈[+22,+161]mm · 9 of 100 slices shown (1 of 4)]
[im 1/100]
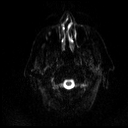
[im 13/100]
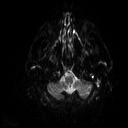
[im 25/100]
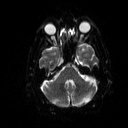
[im 38/100]
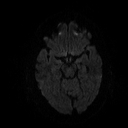
[im 50/100]
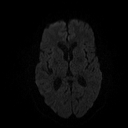
[im 62/100]
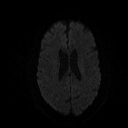
[im 75/100]
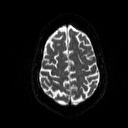
[im 87/100]
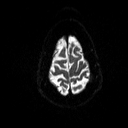
[im 100/100]
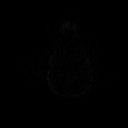

[Series 4: DWI · axial · 3.0mm · 1.88mm/px · z∈[+22,+161]mm · 4 of 48 slices shown (2 of 4)]
[im 1/48]
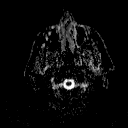
[im 16/48]
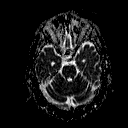
[im 32/48]
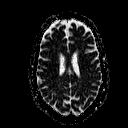
[im 48/48]
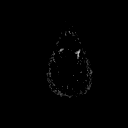

[Series 6: swi_images · axial · 2.0mm · 0.90mm/px · z∈[+15,+165]mm · 7 of 80 slices shown]
[im 1/80]
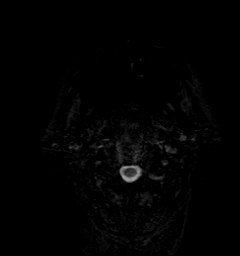
[im 14/80]
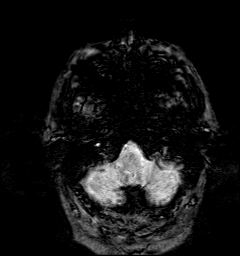
[im 27/80]
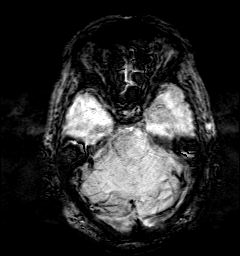
[im 40/80]
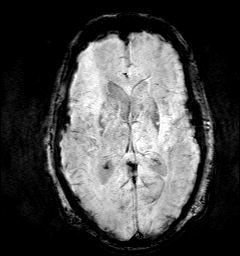
[im 53/80]
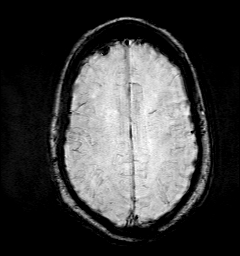
[im 66/80]
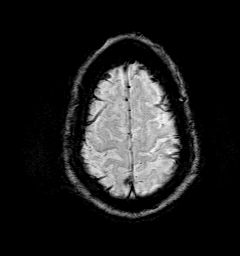
[im 80/80]
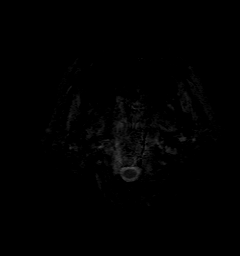

[Series 7: DWI · coronal · 5.0mm · 1.80mm/px · 7 of 79 slices shown (3 of 4)]
[im 1/79]
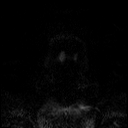
[im 14/79]
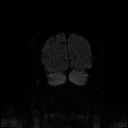
[im 27/79]
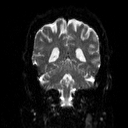
[im 40/79]
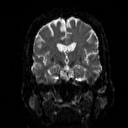
[im 53/79]
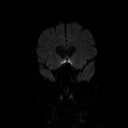
[im 66/79]
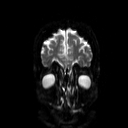
[im 79/79]
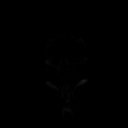

[Series 8: DWI · coronal · 5.0mm · 1.80mm/px · 3 of 40 slices shown (4 of 4)]
[im 1/40]
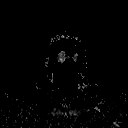
[im 20/40]
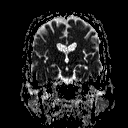
[im 40/40]
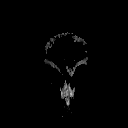

[Series 9: T2 · axial · 5.0mm · 0.51mm/px · z∈[+6,+144]mm · 2 of 22 slices shown (1 of 3)]
[im 1/22]
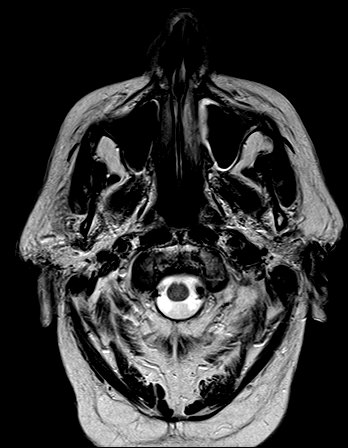
[im 22/22]
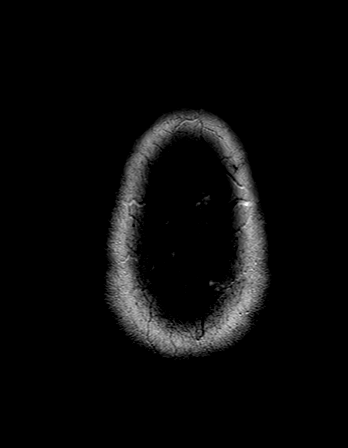

[Series 10: FLAIR · axial · 5.0mm · 0.45mm/px · z∈[+5,+143]mm · 2 of 22 slices shown]
[im 1/22]
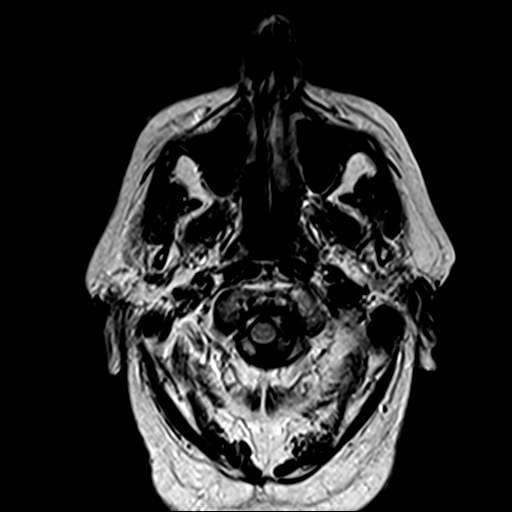
[im 22/22]
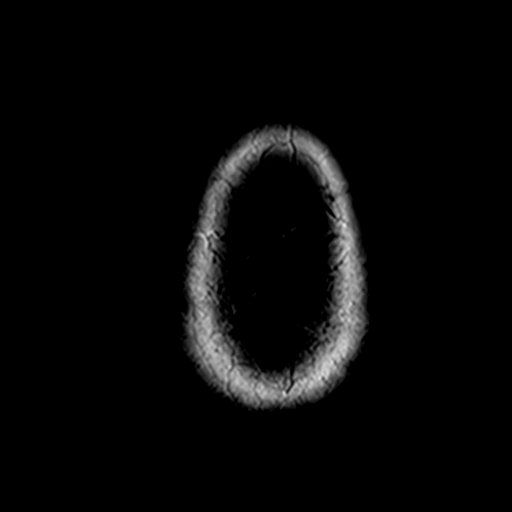

[Series 11: t1_mpr_tra · axial · 2.0mm · 0.45mm/px · z∈[-1,+125]mm · 6 of 80 slices shown]
[im 1/80]
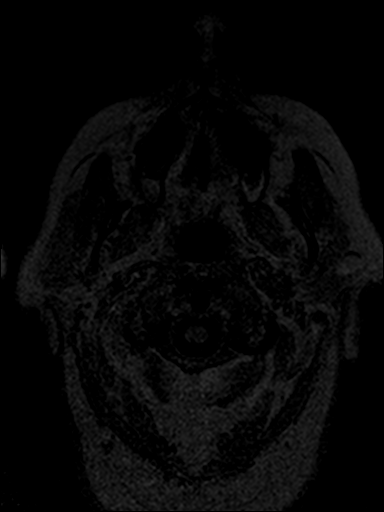
[im 14/80]
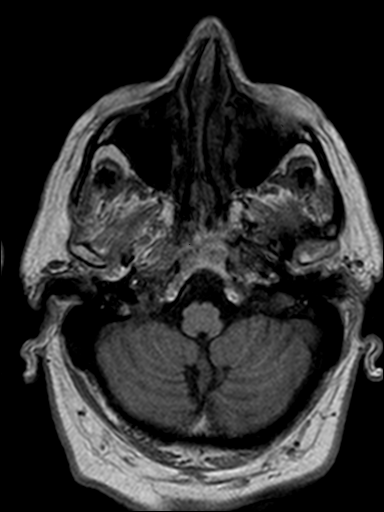
[im 27/80]
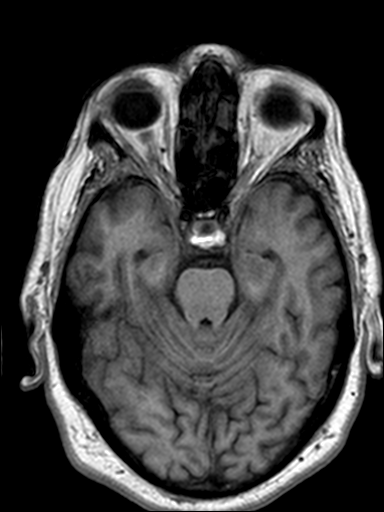
[im 40/80]
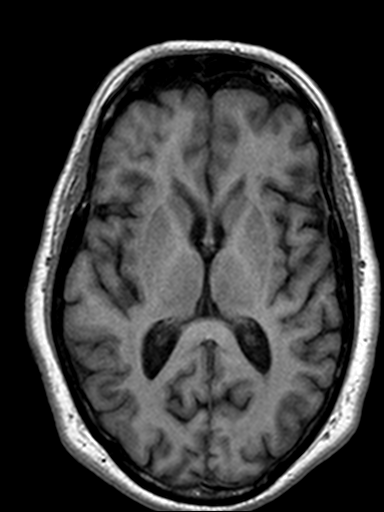
[im 53/80]
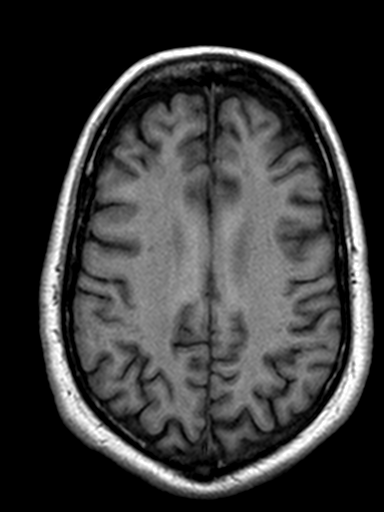
[im 66/80]
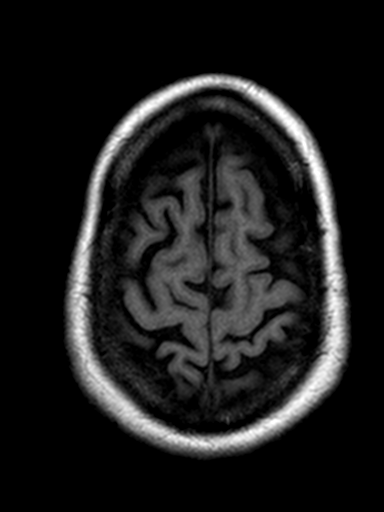

[Series 12: T2 · coronal · 5.0mm · 0.45mm/px · 3 of 32 slices shown (2 of 3)]
[im 1/32]
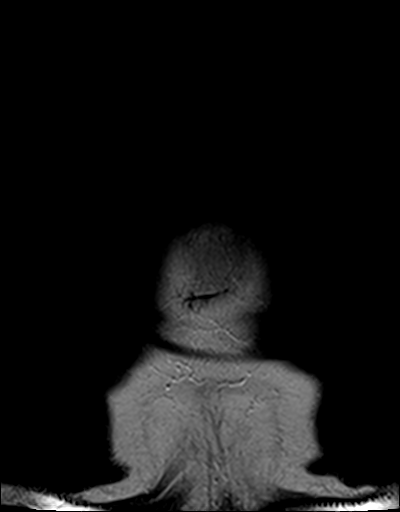
[im 16/32]
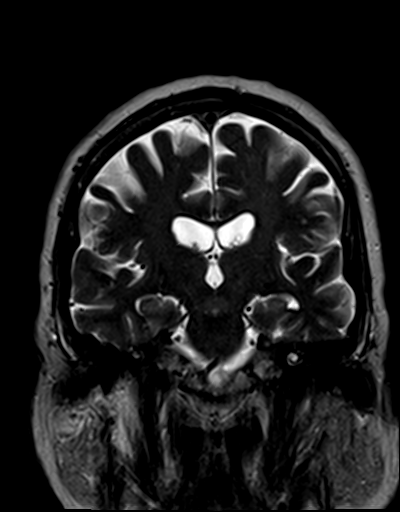
[im 32/32]
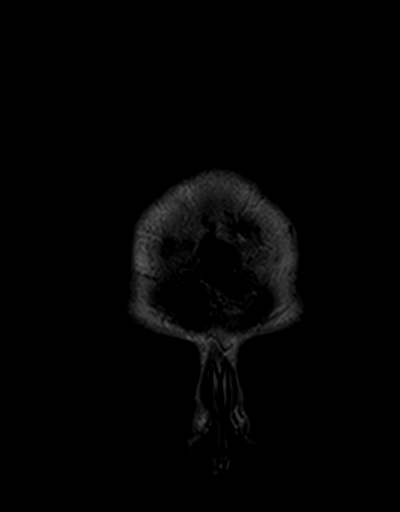

[Series 13: T2 · coronal · 3.0mm · 0.28mm/px · 2 of 28 slices shown (3 of 3)]
[im 1/28]
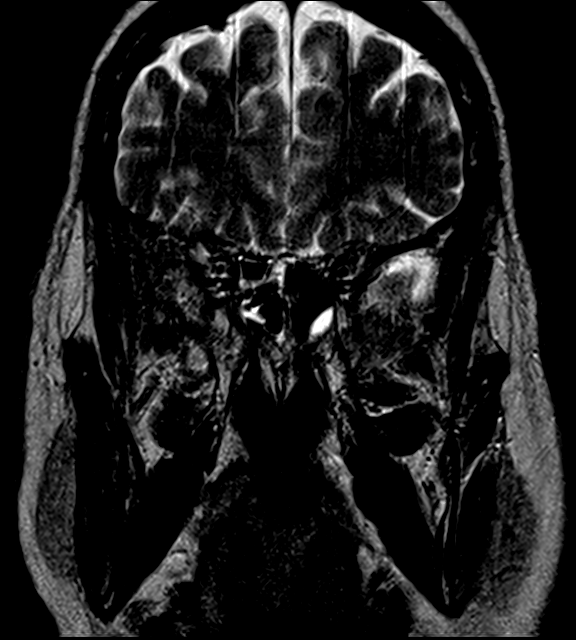
[im 28/28]
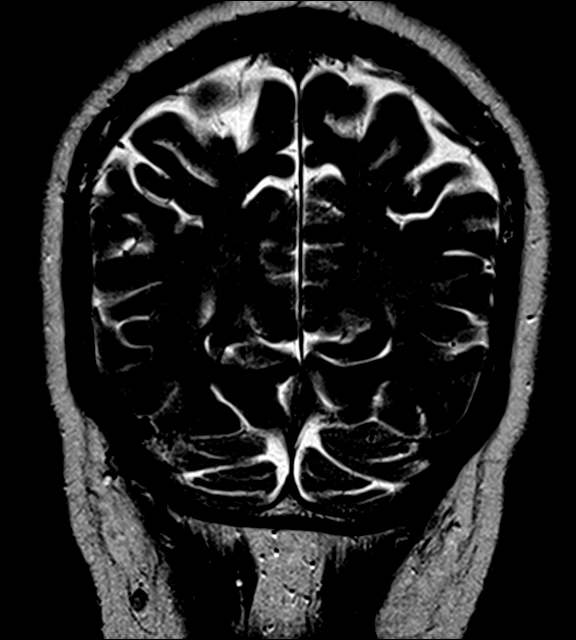

[47 of 48 positions shown; findings below may reference images not displayed]

FINDINGS: Dedicated thin section imaging through the temporal lobes
demonstrates normal volume and signal of the hippocampi. There is no
evidence of acute infarct, intracranial hemorrhage, mass, midline
shift, or extra-axial fluid collection. Mild cerebral atrophy is
normal for age. Small foci of T2 hyperintensity scattered throughout
the cerebral white matter predominantly involve the subcortical
frontal lobes and are nonspecific but compatible with mild chronic
small vessel ischemic disease. The pituitary is grossly
unremarkable.

Prior bilateral cataract extraction is noted. There is moderate
bilateral ethmoid air cell mucosal thickening with mild mucosal
thickening in the other sinuses. A small left mastoid effusion is
noted. Major intracranial vascular flow voids are preserved.
IMPRESSION: 1. No acute intracranial abnormality.
2. Mild chronic small vessel ischemic disease.

## 2017-07-19 ENCOUNTER — Ambulatory Visit
Admission: RE | Admit: 2017-07-19 | Discharge: 2017-07-19 | Disposition: A | Discharge status: Home / Self Care | Payer: Medicare Other | Source: Ambulatory Visit | Attending: Family Medicine | Admitting: Family Medicine

## 2017-07-19 ENCOUNTER — Other Ambulatory Visit: Payer: Self-pay | Admitting: Family Medicine

## 2017-07-19 DIAGNOSIS — R059 Cough, unspecified: Secondary | ICD-10-CM

## 2017-07-19 DIAGNOSIS — R05 Cough: Secondary | ICD-10-CM

## 2017-08-17 ENCOUNTER — Other Ambulatory Visit: Payer: Self-pay | Admitting: Family Medicine

## 2017-08-17 DIAGNOSIS — R05 Cough: Secondary | ICD-10-CM

## 2017-08-17 DIAGNOSIS — R059 Cough, unspecified: Secondary | ICD-10-CM

## 2017-08-17 DIAGNOSIS — R221 Localized swelling, mass and lump, neck: Secondary | ICD-10-CM

## 2017-08-17 DIAGNOSIS — R062 Wheezing: Secondary | ICD-10-CM

## 2017-08-27 ENCOUNTER — Other Ambulatory Visit: Payer: Medicare Other

## 2017-09-01 ENCOUNTER — Encounter: Payer: Self-pay | Admitting: Internal Medicine

## 2017-09-01 ENCOUNTER — Ambulatory Visit: Payer: Medicare Other | Admitting: Internal Medicine

## 2017-09-01 VITALS — BP 128/84 | HR 71 | Ht 70.0 in | Wt 293.4 lb

## 2017-09-01 DIAGNOSIS — R058 Other specified cough: Secondary | ICD-10-CM | POA: Insufficient documentation

## 2017-09-01 DIAGNOSIS — R05 Cough: Secondary | ICD-10-CM | POA: Insufficient documentation

## 2017-09-01 MED ORDER — BENZONATATE 200 MG PO CAPS: 200.0000 mg | ORAL_CAPSULE | Freq: Three times a day (TID) | ORAL | 1 refills | Status: DC | PRN

## 2017-09-01 MED ORDER — PANTOPRAZOLE SODIUM 40 MG PO TBEC: 40.0000 mg | DELAYED_RELEASE_TABLET | Freq: Every day | ORAL | 2 refills | Status: DC

## 2017-09-01 MED ORDER — FAMOTIDINE 20 MG PO TABS: ORAL_TABLET | ORAL | 2 refills | Status: DC

## 2017-09-01 NOTE — Progress Notes (Signed)
Justin Coleman, male    DOB: 01-11-1933,     MRN: [id number]   Brief patient profile:  29 yowm quit smoking 1967 no resp problems then had really bad cough on ACEi > resolved off and since around 2016 pattern of throat tickle most days for which he uses  peppermint and since spring 2019 much worse assoc with sensation of something stuck in throat > cxr suggesting chf > cardiac w/u neg and then ct neck 08/19/17 neg so referred to pulmonary clinic 09/01/2017 by Dr   Jeannetta Nap though pt says better since taken off losartan.   09/01/2017  Initial pulmonary eval/ Justin Coleman  Chief Complaint  Patient presents with  . Pulm Consult    Referred by Dr. Jeannetta Nap for coughing and wheezing. Non-productive cough. Happens more at night. Per patient, it feels like he has a mass in his throat when he coughs. Has had a full cardio work up (echo, CT).    Dyspnea:  MMRC1 = can walk nl pace, flat grade, can't hurry or go uphills or steps s sob   Cough: mostly dry, when lie down assoc with subj wheeze  Has improved since may 2019 off losartan  SABA use: no better with albuterol   Wt up x 45 lb x 3 y   No obvious day to day or daytime variability or assoc excess/ purulent sputum or mucus plugs or hemoptysis or cp or chest tightness,   or overt sinus or hb symptoms.   Sleep: on either side flat  without nocturnal  or early am exacerbation  of respiratory  c/o's or need for noct saba. Also denies any obvious fluctuation of symptoms with weather or environmental changes or other aggravating or alleviating factors except as outlined above   No unusual exposure hx or h/o childhood pna/ asthma or knowledge of premature birth.  Current Allergies, Complete Past Medical History, Past Surgical History, Family History, and Social History were reviewed in [employer] record.  ROS  The following are not active complaints unless bolded Hoarseness, sore throat, dysphagia, dental problems, itching, sneezing,  nasal  congestion or discharge of excess mucus or purulent secretions, ear ache,   fever, chills, sweats, unintended wt loss or wt gain, classically pleuritic or exertional cp,  orthopnea pnd or arm/hand swelling  or leg swelling, presyncope, palpitations, abdominal pain, anorexia, nausea, vomiting, diarrhea  or change in bowel habits or change in bladder habits, change in stools or change in urine, dysuria, hematuria,  rash, arthralgias, visual complaints, headache, numbness, weakness or ataxia or problems with walking or coordination,  change in mood or  memory.           Past Medical History:  Diagnosis Date  . Hypertension     Outpatient Medications Prior to Visit  Medication Sig Dispense Refill  . amLODipine (NORVASC) 2.5 MG tablet     . aspirin EC 81 MG tablet Take 81 mg by mouth daily.    . hydrochlorothiazide (HYDRODIURIL) 50 MG tablet Take 50 mg by mouth daily.    . naproxen (NAPROSYN) 500 MG tablet     . PROAIR HFA 108 (90 Base) MCG/ACT inhaler     . terazosin (HYTRIN) 1 MG capsule     . amoxicillin (AMOXIL) 500 MG capsule Take 1,000 mg by mouth 3 (three) times daily. For 10 days.    Marland Kitchen aspirin EC 81 MG tablet Take 81 mg by mouth daily.    Marland Kitchen atenolol (TENORMIN) 25 MG tablet Take  25 mg by mouth daily.    . hydrochlorothiazide (HYDRODIURIL) 50 MG tablet Take 25 mg by mouth daily.    Marland Kitchen HYDROcodone-acetaminophen (NORCO/VICODIN) 5-325 MG per tablet Take 1-2 tablets by mouth every 4 (four) hours as needed for moderate pain.    Bertram Gala Glycol-Propyl Glycol (SYSTANE OP) Apply 1 drop to eye as needed (dry eyes).    . Polyvinyl Alcohol-Povidone (REFRESH OP) Apply 1 drop to eye as needed (dry eyes.).    Marland Kitchen promethazine-codeine (PHENERGAN WITH CODEINE) 6.25-10 MG/5ML syrup Take 5 mLs by mouth every 4 (four) hours as needed for cough.    . tamsulosin (FLOMAX) 0.4 MG CAPS capsule Take 0.4 mg by mouth daily.                  Objective:     BP 128/84 (BP Location: Left Arm, Patient  Position: Sitting, Cuff Size: Normal)   Pulse 71   Ht 5\' 10"  (1.778 m)   Wt 293 lb 6.4 oz (133.1 kg)   SpO2 95%   BMI 42.10 kg/m   SpO2: 95 %  RA  Obese amb  wm nad      HEENT: nl dentition,  and oropharynx. Nl external ear canals without cough reflex - moderate bilateral non-specific turbinate edema  With trace MP secretions    NECK :  without JVD/Nodes/TM/ nl carotid upstrokes bilaterally   LUNGS: no acc muscle use,  Nl contour chest which is clear to A and P bilaterally without cough on insp or exp maneuvers   CV:  RRR  no s3 or murmur or increase in P2, and no edema   ABD:  soft and nontender with nl inspiratory excursion in the supine position. No bruits or organomegaly appreciated, bowel sounds nl  MS:  Nl gait/ ext warm without deformities, calf tenderness, cyanosis or clubbing No obvious joint restrictions   SKIN: warm and dry without lesions    NEURO:  alert, approp, nl sensorium with  no motor or cerebellar deficits apparent.      I personally reviewed images and agree with radiology impression as follows:  CXR:   07/20/17 1. Mild bibasilar atelectasis/infiltrates. 2.  Cardiomegaly.  No pulmonary venous congestion.      Assessment   Upper airway cough syndrome The most common causes of chronic cough in immunocompetent adults include the following: upper airway cough syndrome (UACS), previously referred to as postnasal drip syndrome (PNDS), which is caused by variety of rhinosinus conditions; (2) asthma; (3) GERD; (4) chronic bronchitis from cigarette smoking or other inhaled environmental irritants; (5) nonasthmatic eosinophilic bronchitis; and (6) bronchiectasis.   These conditions, singly or in combination, have accounted for up to 94% of the causes of chronic cough in prospective studies.   Other conditions have constituted no >6% of the causes in prospective studies These have included bronchogenic carcinoma, chronic interstitial pneumonia,  sarcoidosis, left ventricular failure, ACEI-induced cough, and aspiration from a condition associated with pharyngeal dysfunction.    Chronic cough is often simultaneously caused by more than one condition. A single cause has been found from 38 to 82% of the time, multiple causes from 18 to 62%. Multiply caused cough has been the result of three diseases up to 42% of the time.      Most likely this is Upper airway cough syndrome (previously labeled PNDS),  is so named because it's frequently impossible to sort out how much is  CR/sinusitis with freq throat clearing (which can be related to primary GERD)  vs  causing  secondary (" extra esophageal")  GERD from wide swings in gastric pressure that occur with throat clearing, often  promoting self use of mint and menthol lozenges that reduce the lower esophageal sphincter tone and exacerbate the problem further in a cyclical fashion.   These are the same pts (now being labeled as having "irritable larynx syndrome" by some cough centers) who not infrequently have a history of having failed to tolerate ace inhibitors (and sometime generic losartan) ,  dry powder inhalers or biphosphonates or report having atypical/extraesophageal reflux symptoms that don't respond to standard doses of PPI  and are easily confused as having aecopd or asthma flares by even experienced allergists/ pulmonologists (myself included).    Of the three most common causes of  Sub-acute / recurrent or chronic cough, only one (GERD)  can actually contribute to/ trigger  the other two (asthma and post nasal drip syndrome)  and perpetuate the cylce of cough.  While not intuitively obvious, many patients with chronic low grade reflux do not cough until there is a primary insult that disturbs the protective epithelial barrier and exposes sensitive nerve endings.   This is typically viral but can due to PNDS and  either may apply here.     The point is that once this occurs, it is  difficult to eliminate the cycle  using anything but a maximally effective acid suppression regimen at least in the short run, accompanied by an appropriate diet to address non acid GERD and control / eliminate the cough itself for at least 3 days with tessalon 200 tid initially then adding codeine if needed    F/u in 6 weeks, sooner prn   Advised pt:  The standardized cough guidelines published in Chest by [facility] [place] in 2006 are still the best available and consist of a multiple step process (up to 12!) , not a single office visit,  and are intended  to address this problem logically,  with an alogrithm dependent on response to empiric treatment at  each progressive step  to determine a specific diagnosis with  minimal addtional testing needed. Therefore if adherence is an issue or can't be accurately verified,  it's very unlikely the standard evaluation and treatment will be successful here.    Furthermore, response to therapy (other than acute cough suppression, which should only be used short term with avoidance of narcotic containing cough syrups if possible), can be a gradual process for which the patient is not likely to  perceive immediate benefit.  Unlike going to an eye doctor where the best perscription is almost always the first one and is immediately effective, this is almost never the case in the management of chronic cough syndromes. Therefore the patient needs to commit up front to consistently adhere to recommendations  for up to 6 weeks of therapy directed at the likely underlying problem(s) before the response can be reasonably evaluated.   Total time devoted to counseling  > 50 % of initial 60 min office visit:  review case with pt/ discussion of options/alternatives/ personally creating written customized instructions  in presence of pt  then going over those specific  Instructions directly with the pt including how to use all of the meds but in particular covering each new  medication in detail and the difference between the maintenance= "automatic" meds and the prns using an action plan format for the latter (If this problem/symptom => do that organization reading Left to right).  Please see  AVS from this visit for a full list of these instructions which I personally wrote for this pt and  are unique to this visit.       Sandrea Hughs, MD 09/01/2017

## 2017-09-01 NOTE — Patient Instructions (Addendum)
tessilon pearls 200 mg three times until no longer then as needed   For drainage / throat tickle try take CHLORPHENIRAMINE  4 mg - take one every 4 hours as needed - available over the counter- may cause drowsiness so start with just a bedtime dose or two and see how you tolerate it before trying in daytime    Pantoprazole (protonix) 40 mg   Take  30-60 min before first meal of the day and Pepcid (famotidine)  20 mg one after supper  until return to office - this is the best way to tell whether stomach acid is contributing to your problem.    GERD (REFLUX)  is an extremely common cause of respiratory symptoms just like yours , many times with no obvious heartburn at all.    It can be treated with medication, but also with lifestyle changes including elevation of the head of your bed (ideally with 6 inch  bed blocks),  Smoking cessation, avoidance of late meals, excessive alcohol, and avoid fatty foods, chocolate, peppermint, colas, red wine, and acidic juices such as orange juice.  NO MINT OR MENTHOL PRODUCTS SO NO COUGH DROPS   USE SUGARLESS CANDY INSTEAD (Jolley ranchers or Stover's or Life Savers) or even ice chips will also do - the key is to swallow to prevent all throat clearing. NO OIL BASED VITAMINS - use powdered substitutes      Please schedule a follow up office visit in 6 weeks, call sooner if needed

## 2017-09-02 ENCOUNTER — Encounter: Payer: Self-pay | Admitting: Internal Medicine

## 2017-09-02 NOTE — Assessment & Plan Note (Signed)
The most common causes of chronic cough in immunocompetent adults include the following: upper airway cough syndrome (UACS), previously referred to as postnasal drip syndrome (PNDS), which is caused by variety of rhinosinus conditions; (2) asthma; (3) GERD; (4) chronic bronchitis from cigarette smoking or other inhaled environmental irritants; (5) nonasthmatic eosinophilic bronchitis; and (6) bronchiectasis.   These conditions, singly or in combination, have accounted for up to 94% of the causes of chronic cough in prospective studies.   Other conditions have constituted no >6% of the causes in prospective studies These have included bronchogenic carcinoma, chronic interstitial pneumonia, sarcoidosis, left ventricular failure, ACEI-induced cough, and aspiration from a condition associated with pharyngeal dysfunction.    Chronic cough is often simultaneously caused by more than one condition. A single cause has been found from 38 to 82% of the time, multiple causes from 18 to 62%. Multiply caused cough has been the result of three diseases up to 42% of the time.      Most likely this is Upper airway cough syndrome (previously labeled PNDS),  is so named because it's frequently impossible to sort out how much is  CR/sinusitis with freq throat clearing (which can be related to primary GERD)   vs  causing  secondary (" extra esophageal")  GERD from wide swings in gastric pressure that occur with throat clearing, often  promoting self use of mint and menthol lozenges that reduce the lower esophageal sphincter tone and exacerbate the problem further in a cyclical fashion.   These are the same pts (now being labeled as having "irritable larynx syndrome" by some cough centers) who not infrequently have a history of having failed to tolerate ace inhibitors (and sometime generic losartan) ,  dry powder inhalers or biphosphonates or report having atypical/extraesophageal reflux symptoms that don't respond to  standard doses of PPI  and are easily confused as having aecopd or asthma flares by even experienced allergists/ pulmonologists (myself included).    Of the three most common causes of  Sub-acute / recurrent or chronic cough, only one (GERD)  can actually contribute to/ trigger  the other two (asthma and post nasal drip syndrome)  and perpetuate the cylce of cough.  While not intuitively obvious, many patients with chronic low grade reflux do not cough until there is a primary insult that disturbs the protective epithelial barrier and exposes sensitive nerve endings.   This is typically viral but can due to PNDS and  either may apply here.     The point is that once this occurs, it is difficult to eliminate the cycle  using anything but a maximally effective acid suppression regimen at least in the short run, accompanied by an appropriate diet to address non acid GERD and control / eliminate the cough itself for at least 3 days with tessalon 200 tid initially then adding codeine if needed    F/u in 6 weeks, sooner prn   Advised pt:  The standardized cough guidelines published in Chest by [facility] [place] in 2006 are still the best available and consist of a multiple step process (up to 12!) , not a single office visit,  and are intended  to address this problem logically,  with an alogrithm dependent on response to empiric treatment at  each progressive step  to determine a specific diagnosis with  minimal addtional testing needed. Therefore if adherence is an issue or can't be accurately verified,  it's very unlikely the standard evaluation and treatment will be successful here.  Furthermore, response to therapy (other than acute cough suppression, which should only be used short term with avoidance of narcotic containing cough syrups if possible), can be a gradual process for which the patient is not likely to  perceive immediate benefit.  Unlike going to an eye doctor where the best perscription  is almost always the first one and is immediately effective, this is almost never the case in the management of chronic cough syndromes. Therefore the patient needs to commit up front to consistently adhere to recommendations  for up to 6 weeks of therapy directed at the likely underlying problem(s) before the response can be reasonably evaluated.   Total time devoted to counseling  > 50 % of initial 60 min office visit:  review case with pt/ discussion of options/alternatives/ personally creating written customized instructions  in presence of pt  then going over those specific  Instructions directly with the pt including how to use all of the meds but in particular covering each new medication in detail and the difference between the maintenance= "automatic" meds and the prns using an action plan format for the latter (If this problem/symptom => do that organization reading Left to right).  Please see AVS from this visit for a full list of these instructions which I personally wrote for this pt and  are unique to this visit.

## 2017-09-15 ENCOUNTER — Telehealth: Payer: Self-pay | Admitting: Internal Medicine

## 2017-09-15 NOTE — Telephone Encounter (Signed)
Attempted to contact pt. Received a fast busy signal x2. Will try back.  

## 2017-09-16 NOTE — Telephone Encounter (Signed)
Attempted to call pt. I did not receive an answer. I have left a message for pt to return our call.  

## 2017-09-16 NOTE — Telephone Encounter (Signed)
Pt is returning call. Cb is [phone].

## 2017-09-17 NOTE — Telephone Encounter (Signed)
Patient had concerns about how late he can eat.  I explained last OV note GERD instructions.  Patient wanted to make sure he could eat supper at 6pm.  Instructed that it is fine.  Patient does not go to bed before 10pm. Instructed not to eat before bedtime. Patient stated understanding. Nothing further at this time.  Per MW OV 09/01/17 Pantoprazole (protonix) 40 mg   Take  30-60 min before first meal of the day and Pepcid (famotidine)  20 mg one after supper  until return to office - this is the best way to tell whether stomach acid is contributing to your problem.

## 2017-10-13 ENCOUNTER — Encounter: Payer: Self-pay | Admitting: Internal Medicine

## 2017-10-13 ENCOUNTER — Ambulatory Visit: Payer: Medicare Other | Admitting: Internal Medicine

## 2017-10-13 VITALS — BP 142/70 | HR 86 | Ht 69.5 in | Wt 292.0 lb

## 2017-10-13 DIAGNOSIS — R05 Cough: Secondary | ICD-10-CM | POA: Diagnosis not present

## 2017-10-13 DIAGNOSIS — R058 Other specified cough: Secondary | ICD-10-CM

## 2017-10-13 NOTE — Progress Notes (Signed)
Justin Coleman, male    DOB: 10/03/1932,     MRN: [id number]   Brief patient profile:  [age] yowm quit smoking 1967 no resp problems then had really bad cough on ACEi > resolved off and since around 2016 pattern of throat tickle most days for which he uses  peppermint and since spring 2019 much worse assoc with sensation of something stuck in throat > cxr suggesting chf > cardiac w/u neg and then ct neck 08/19/17 neg so referred to pulmonary clinic 09/01/2017 by Dr   Jeannetta NapElkins though pt says better since taken off losartan.    Marland Kitchen.History of Present Illness  09/01/2017  Initial pulmonary eval/ Gurvir Schrom  Chief Complaint  Patient presents with  . Pulm Consult    Referred by Dr. Jeannetta NapElkins for coughing and wheezing. Non-productive cough. Happens more at night. Per patient, it feels like he has a mass in his throat when he coughs. Has had a full cardio work up (echo, CT).    Dyspnea:  MMRC1 = can walk nl pace, flat grade, can't hurry or go uphills or steps s sob   Cough: mostly dry, when lie down assoc with subj wheeze  Has improved since may 2019 off losartan SABA use: no better with albuterol  Wt up x 45 lb x 3 y  rec tessilon pearls 200 mg three times until no longer then as needed  For drainage / throat tickle try take CHLORPHENIRAMINE  4 mg - take one every 4 hours as needed -  Pantoprazole (protonix) 40 mg   Take  30-60 min before first meal of the day and Pepcid (famotidine)  20 mg one after supper  until return to office - this is the best way to tell whether stomach acid is contributing to your problem.   GERD diet     10/13/2017  f/u ov/Savas Elvin re: uacs Chief Complaint  Patient presents with  . Follow-up    Cough is much improved. He does occ cough in the early am with yellow sputum. No new co's. He has not had to use his rescue inhaler.    Dyspnea: MMRC1 = can walk nl pace, flat grade, can't hurry or go uphills or steps s sob    Cough: much improved  sometimes still in am / better p sip water and no  longer using any cough suppressants    SABA use: not needing  02: none     No obvious day to day or daytime variability or assoc excess/ purulent sputum or mucus plugs or hemoptysis or cp or chest tightness, subjective wheeze or overt sinus or hb symptoms.   Sleeping: 4in bed block= fine  without nocturnal  or early am exacerbation  of respiratory  c/o's or need for noct saba. Also denies any obvious fluctuation of symptoms with weather or environmental changes or other aggravating or alleviating factors except as outlined above   No unusual exposure hx or h/o childhood pna/ asthma or knowledge of premature birth.  Current Allergies, Complete Past Medical History, Past Surgical History, Family History, and Social History were reviewed in [facility] electronic medical record.  ROS  The following are not active complaints unless bolded Hoarseness, sore throat, dysphagia, dental problems, itching, sneezing,  nasal congestion or discharge of excess mucus or purulent secretions, ear ache,   fever, chills, sweats, unintended wt loss or wt gain, classically pleuritic or exertional cp,  orthopnea pnd or arm/hand swelling  or leg swelling, presyncope, palpitations, abdominal pain, anorexia,  nausea, vomiting, diarrhea  or change in bowel habits or change in bladder habits, change in stools or change in urine, dysuria, hematuria,  rash, arthralgias, visual complaints, headache, numbness, weakness or ataxia or problems with walking or coordination,  change in mood or  memory.        Current Meds  Medication Sig  . amLODipine (NORVASC) 2.5 MG tablet   . aspirin EC 81 MG tablet Take 81 mg by mouth daily.  . famotidine (PEPCID) 20 MG tablet One after supper  . hydrochlorothiazide (HYDRODIURIL) 50 MG tablet Take 50 mg by mouth daily.  . pantoprazole (PROTONIX) 40 MG tablet Take 1 tablet (40 mg total) by mouth daily. Take 30-60 min before first meal of the day  . PROAIR HFA 108 (90 Base) MCG/ACT inhaler    . terazosin (HYTRIN) 1 MG capsule               Objective:        Pleasant obese wm nad   Wt Readings from Last 3 Encounters:  10/13/17 292 lb (132.5 kg)  09/01/17 293 lb 6.4 oz (133.1 kg)     Vital signs reviewed - Note on arrival 02 sats  92% on RA       HEENT: nl dentition, turbinates bilaterally, and oropharynx. Nl external ear canals without cough reflex   NECK :  without JVD/Nodes/TM/ nl carotid upstrokes bilaterally   LUNGS: no acc muscle use,  Nl contour chest which is clear to A and P bilaterally without cough on insp or exp maneuvers   CV:  RRR  no s3 or murmur or increase in P2, and no edema   ABD:  soft and nontender with nl inspiratory excursion in the supine position. No bruits or organomegaly appreciated, bowel sounds nl  MS:  Nl gait/ ext warm without deformities, calf tenderness, cyanosis or clubbing No obvious joint restrictions   SKIN: warm and dry without lesions    NEURO:  alert, approp, nl sensorium with  no motor or cerebellar deficits apparent.          I personally reviewed images and agree with radiology impression as follows:  CXR:   07/20/17 1. Mild bibasilar atelectasis/infiltrates. 2.  Cardiomegaly.  No pulmonary venous congestion.      Assessment

## 2017-10-13 NOTE — Patient Instructions (Signed)
After 6 weeks ok to try to protonix and just take pepcid 20 mg after bfast  And supper x 2 weeks then take it as needed  If cough flares off protonix> see Dr Jeannetta NapElkins for possible GI referral

## 2017-10-14 ENCOUNTER — Encounter: Payer: Self-pay | Admitting: Internal Medicine

## 2017-10-14 NOTE — Assessment & Plan Note (Addendum)
Improve off acei 2016 - recurred spring 2019 improved off mints/ on gerd rx   Clearly better again with simple rx for Upper airway cough syndrome (previously labeled PNDS),  is so named because it's frequently impossible to sort out how much is  CR/sinusitis with freq throat clearing (which can be related to primary GERD)   vs  causing  secondary (" extra esophageal")  GERD from wide swings in gastric pressure that occur with throat clearing, often  promoting self use of mint and menthol lozenges that reduce the lower esophageal sphincter tone and exacerbate the problem further in a cyclical fashion.   These are the same pts (now being labeled as having "irritable larynx syndrome" by some cough centers) who not infrequently have a history of having failed to tolerate ace inhibitors,  dry powder inhalers or biphosphonates or report having atypical/extraesophageal reflux symptoms that don't respond to standard doses of PPI  and are easily confused as having aecopd or asthma flares by even experienced allergists/ pulmonologists (myself included).   I had an extended final summary discussion with the patient reviewing all relevant studies completed to date and  lasting 15 to 20 minutes of a 25 minute visit on the following issues:   Discussed the recent press about ppi's in the context of a statistically significant (but questionably clinically relevant) increase in CRI in pts on ppi vs h2's > bottom line is the lowest dose of ppi that controls   gerd is the right dose and if that dose is zero that's fine esp since h2's are cheaper.    Would complete 3 months of rx then taper to h2's and if flares as taper these then consider gi eval and f/u here prn   Each maintenance medication was reviewed in detail including most importantly the difference between maintenance and as needed and under what circumstances the prns are to be used.  Please see AVS for specific  Instructions which are unique to this visit and  I personally typed out  which were reviewed in detail in writing with the patient and a copy provided.

## 2017-10-14 NOTE — Assessment & Plan Note (Signed)
Body mass index is 42.5 kg/m.  -   No results found for: TSH   Contributing to gerd risk/ doe/reviewed the need and the process to achieve and maintain neg calorie balance > defer f/u primary care including intermittently monitoring thyroid status

## 2017-11-29 ENCOUNTER — Telehealth: Payer: Self-pay | Admitting: Internal Medicine

## 2017-11-29 MED ORDER — PANTOPRAZOLE SODIUM 40 MG PO TBEC: 40.0000 mg | DELAYED_RELEASE_TABLET | Freq: Every day | ORAL | 2 refills | Status: DC

## 2017-11-29 NOTE — Telephone Encounter (Signed)
Pt returning call. Pt contact number [phone]

## 2017-11-29 NOTE — Telephone Encounter (Signed)
Can try protonix Take 30- 60 min before your first and last meals of the day but needs to f/u with ov w/in 2 weeks, call sooner if condition worsens

## 2017-11-29 NOTE — Telephone Encounter (Signed)
Patient is taking protonix and pepcid, cough improved for a short amount of time and later cam e back after being placed on a antibiotic for a boil on his arm. Patient feels like his wheezing and SOB has gotten worse mostly at night on exhalation. Patient would like to know whether or not he needs to increase his protonix.

## 2017-11-29 NOTE — Telephone Encounter (Signed)
Returned call to patient.  He stated that MW's orders were to take his protonix with first and last meals of the day and an OV was made with MW for 12/07/2017 at 1100. Patient stated that he needs a refill for his Protonix, he only has 4 left.  Refill sent to [facility].  Nothing further at this time.

## 2017-11-29 NOTE — Telephone Encounter (Signed)
Called patient unable to reach left message to give us a call back.

## 2017-11-29 NOTE — Telephone Encounter (Signed)
Called and spoke with patient. He understands to take his protonix with first and last meals of the day and an OV was made with MW for 12/07/2017 at 1100.    Nothing further needed.

## 2017-12-07 ENCOUNTER — Ambulatory Visit: Payer: Medicare Other | Admitting: Internal Medicine

## 2017-12-07 ENCOUNTER — Encounter: Payer: Self-pay | Admitting: Internal Medicine

## 2017-12-07 VITALS — BP 152/78 | HR 73 | Ht 69.5 in | Wt 295.4 lb

## 2017-12-07 DIAGNOSIS — R05 Cough: Secondary | ICD-10-CM

## 2017-12-07 DIAGNOSIS — Z23 Encounter for immunization: Secondary | ICD-10-CM | POA: Diagnosis not present

## 2017-12-07 DIAGNOSIS — R058 Other specified cough: Secondary | ICD-10-CM

## 2017-12-07 NOTE — Progress Notes (Signed)
Justin Coleman, male    DOB: 06-25-1932,     MRN: [id number]   Brief patient profile:  82 yowm quit smoking 1967 no resp problems then had really bad cough on ACEi > resolved off and since around 2016 pattern of throat tickle most days for which he uses  peppermint and since spring 2019 much worse assoc with sensation of something stuck in throat > cxr suggesting chf > cardiac w/u neg and then ct neck 08/19/17 neg so referred to pulmonary clinic 09/01/2017 by Dr   Jeannetta Nap though pt says better since taken off losartan.    Marland KitchenHistory of Present Illness  09/01/2017  Initial pulmonary eval/ Chandan Fly  Chief Complaint  Patient presents with  . Pulm Consult    Referred by Dr. Jeannetta Nap for coughing and wheezing. Non-productive cough. Happens more at night. Per patient, it feels like he has a mass in his throat when he coughs. Has had a full cardio work up (echo, CT).    Dyspnea:  MMRC1 = can walk nl pace, flat grade, can't hurry or go uphills or steps s sob   Cough: mostly dry, when lie down assoc with subj wheeze  Has improved since may 2019 off losartan SABA use: no better with albuterol  Wt up x 45 lb x 3 y  rec tessilon pearls 200 mg three times until no longer then as needed  For drainage / throat tickle try take CHLORPHENIRAMINE  4 mg - take one every 4 hours as needed -  Pantoprazole (protonix) 40 mg   Take  30-60 min before first meal of the day and Pepcid (famotidine)  20 mg one after supper  until return to office - this is the best way to tell whether stomach acid is contributing to your problem.   GERD diet     10/13/2017  f/u ov/Gianni Fuchs re: uacs Chief Complaint  Patient presents with  . Follow-up    Cough is much improved. He does occ cough in the early am with yellow sputum. No new co's. He has not had to use his rescue inhaler.   Dyspnea: MMRC1 = can walk nl pace, flat grade, can't hurry or go uphills or steps s sob    Cough: much improved  sometimes still in am / better p sip water and no  longer using any cough suppressants  SABA use: not needing  02: none   rec After 6 weeks ok to try to stop  protonix and just take pepcid 20 mg after bfast  And supper x 2 weeks then take it as needed If cough flares off protonix> see Dr Jeannetta Nap for possible GI referral      12/07/2017  f/u ov/Ivaan Liddy re: uacs ? ppi dependent/ did not follow instructions re Pepcid otc Chief Complaint  Patient presents with  . Follow-up    Cough is not bothering him. He states he feels great. He has not needed his albuterol inhaler. No new co's.   cough flared on protonix before bfast daily and all better now on ppi on bid  Was also "wheezing" noct on just ppi in am but better now No need for saba  Not limited by breathing from desired activities     No obvious day to day or daytime variability or assoc excess/ purulent sputum or mucus plugs or hemoptysis or cp or chest tightness, subjective wheeze or overt sinus or hb symptoms.   Sleeping now  without nocturnal  or early am exacerbation  of respiratory  c/o's or need for noct saba. Also denies any obvious fluctuation of symptoms with weather or environmental changes or other aggravating or alleviating factors except as outlined above   No unusual exposure hx or h/o childhood pna/ asthma or knowledge of premature birth.  Current Allergies, Complete Past Medical History, Past Surgical History, Family History, and Social History were reviewed in [employer] [facility] record.  ROS  The following are not active complaints unless bolded Hoarseness, sore throat, dysphagia, dental problems, itching, sneezing,  nasal congestion or discharge of excess mucus or purulent secretions, ear ache,   fever, chills, sweats, unintended wt loss or wt gain, classically pleuritic or exertional cp,  orthopnea pnd or arm/hand swelling  or leg swelling, presyncope, palpitations, abdominal pain, anorexia, nausea, vomiting, diarrhea  or change in bowel habits or change in  bladder habits, change in stools or change in urine, dysuria, hematuria,  rash, arthralgias, visual complaints, headache, numbness, weakness or ataxia or problems with walking or coordination,  change in mood or  memory.        Current Meds  Medication Sig  . amLODipine (NORVASC) 5 MG tablet Take 5 mg by mouth daily.  Marland Kitchen aspirin EC 81 MG tablet Take 81 mg by mouth daily.  . hydrochlorothiazide (HYDRODIURIL) 50 MG tablet Take 50 mg by mouth daily.  . pantoprazole (PROTONIX) 40 MG tablet Take 1 tablet (40 mg total) by mouth daily. Take 30-60 min before first meal of the day  . PROAIR HFA 108 (90 Base) MCG/ACT inhaler Inhale 2 puffs into the lungs every 4 (four) hours as needed.   . terazosin (HYTRIN) 1 MG capsule         Objective:      amb obese wm nad   12/07/2017       295    10/13/17 292 lb (132.5 kg)  09/01/17 293 lb 6.4 oz (133.1 kg)     Vital signs reviewed - Note on arrival 02 sats  96% on ra        HEENT: nl dentition, turbinates bilaterally, and oropharynx. Nl external ear canals without cough reflex   NECK :  without JVD/Nodes/TM/ nl carotid upstrokes bilaterally   LUNGS: no acc muscle use,  Nl contour chest which is clear to A and P bilaterally without cough on insp or exp maneuvers   CV:  RRR  no s3 or murmur or increase in P2, and trace pitting both ankles   ABD:  Obese/  soft and nontender with nl inspiratory excursion in the supine position. No bruits or organomegaly appreciated, bowel sounds nl  MS:  Nl gait/ ext warm without deformities, calf tenderness, cyanosis or clubbing No obvious joint restrictions   SKIN: warm and dry without lesions    NEURO:  alert, approp, nl sensorium with  no motor or cerebellar deficits apparent.               Assessment

## 2017-12-07 NOTE — Patient Instructions (Addendum)
Ok to go back and just take the protonix Take 30-60 min before first meal of the day and if cough starts back up for any reason then just add pepcid 20 mg after supper and if not improving at that point you may need to be referred to a GI doctor  Work on diet as previously discussed    Pulmonary follow up is as needed

## 2017-12-08 ENCOUNTER — Encounter: Payer: Self-pay | Admitting: Internal Medicine

## 2017-12-08 NOTE — Assessment & Plan Note (Signed)
Body mass index is 43 kg/m.  -  trending up still  No results found for: TSH   Contributing to gerd risk/ doe/reviewed the need and the process to achieve and maintain neg calorie balance > defer f/u primary care including intermittently monitoring thyroid status

## 2017-12-08 NOTE — Assessment & Plan Note (Addendum)
Improved  off acei 2016 - recurred spring 2019 improved off mints/ on gerd rx > resolved 12/07/2017 on ppi bid  Upper airway cough syndrome (previously labeled PNDS),  is so named because it's frequently impossible to sort out how much is  CR/sinusitis with freq throat clearing (which can be related to primary GERD)   vs  causing  secondary (" extra esophageal")  GERD from wide swings in gastric pressure that occur with throat clearing, often  promoting self use of mint and menthol lozenges that reduce the lower esophageal sphincter tone and exacerbate the problem further in a cyclical fashion.   These are the same pts (now being labeled as having "irritable larynx syndrome" by some cough centers) who not infrequently have a history of having failed to tolerate ace inhibitors,  dry powder inhalers or biphosphonates or report having atypical/extraesophageal reflux symptoms that don't respond to standard doses of PPI  and are easily confused as having aecopd or asthma flares by even experienced allergists/ pulmonologists (myself included).    I had an extended final summary discussion with the patient and wife reviewing all relevant studies completed to date and  lasting 15 to 20 minutes of a 25 minute visit on the following issues:   Discussed the recent press about ppi's in the context of a statistically significant (but questionably clinically relevant) increase in CRI in pts on ppi vs h2's > bottom line is the lowest dose of ppi that controls   gerd is the right dose and if that dose is zero that's fine esp since h2's are cheaper.     recs 1) now that cough has resolved to his satisfaction ok to try back on just ppi in am and if any pm/ noct symptoms this time try pepcid 20 mg p supper and if not better defer to Dr Jeannetta Nap whether to continue ppi indefinitely bid or refer to GI  2) lifestyle recs reviewed esp re elevating HOB on blocks   3) Each maintenance medication was reviewed in detail  including most importantly the difference between maintenance and as needed and under what circumstances the prns are to be used.  Please see AVS for specific  Instructions which are unique to this visit and I personally typed out  which were reviewed in detail in writing with the patient and a copy provided.    F/u can be prn

## 2019-06-19 ENCOUNTER — Encounter: Payer: Self-pay | Admitting: Cardiology

## 2019-06-19 ENCOUNTER — Other Ambulatory Visit: Payer: Self-pay

## 2019-06-19 ENCOUNTER — Telehealth: Payer: Self-pay

## 2019-06-19 ENCOUNTER — Ambulatory Visit: Payer: Medicare PPO | Admitting: Cardiology

## 2019-06-19 VITALS — BP 121/61 | HR 88 | Temp 97.3°F | Resp 16 | Ht 70.0 in | Wt 291.0 lb

## 2019-06-19 DIAGNOSIS — I10 Essential (primary) hypertension: Secondary | ICD-10-CM

## 2019-06-19 DIAGNOSIS — I209 Angina pectoris, unspecified: Secondary | ICD-10-CM

## 2019-06-19 DIAGNOSIS — Z6841 Body Mass Index (BMI) 40.0 and over, adult: Secondary | ICD-10-CM

## 2019-06-19 DIAGNOSIS — Z87891 Personal history of nicotine dependence: Secondary | ICD-10-CM

## 2019-06-19 DIAGNOSIS — E66813 Obesity, class 3: Secondary | ICD-10-CM

## 2019-06-19 MED ORDER — METOPROLOL SUCCINATE ER 25 MG PO TB24: 25.0000 mg | ORAL_TABLET | Freq: Every morning | ORAL | 3 refills | Status: DC

## 2019-06-19 NOTE — Progress Notes (Signed)
Justin Coleman Date of Birth: May 05, 1932 MRN: [id number] Primary Care Provider:Elkins, Curt Jews, MD Former Cardiology Providers: Dr. Wynonia Lawman  Primary Cardiologist: Rex Kras, DO (established care 06/19/2019)  Date: 06/19/19  REASON FOR CONSULT: Chest tightness and anterior infarct  Chief Complaint  Patient presents with  . New Patient (Initial Visit)  . Chest Pain  . Shortness of Breath    REQUESTING PHYSICIAN:  Leonard Downing, MD [place],  [place] [id number]  HPI  Justin Coleman is a 86 y.o. male who presents to the office with a chief complaint of " chest pain and shortness of breath." Patient's past medical history and cardiac risk factors include: Hypertension, former smoker, advanced age, obesity.   Patient is accompanied by his wife at today's office visit.  Patient was referred to the office at the request of his primary care for evaluation of chest pain.  Chest pain and shortness of breath: Patient states that he has been having chest discomfort for the past 6 months and it has been getting progressively worse.  The pain is located substernally, effort related, improved with rest.  The intensity of the pain at its worst is 7 out of 10.  He has been given sublingual nitroglycerin tablets but has not used them.  No associated symptoms of nausea, vomiting, diaphoresis.  He does have at times effort related dyspnea which has become more prominent.  He denies lower extremity swelling, orthopnea, or paroxysmal nocturnal dyspnea.  He does states that his overall functional capacity has decreased since 2012.  When asked why he has delayed cardiovascular work-up no clear explanation.  Denies prior history of coronary artery disease, myocardial infarction, congestive heart failure, deep venous thrombosis, pulmonary embolism, stroke, transient ischemic attack.  FUNCTIONAL STATUS: Does his best to maintain his vegetable garden (but has been difficult over  the last 6 month).    ALLERGIES: No Known Allergies  MEDICATION LIST PRIOR TO VISIT: Current Outpatient Medications on File Prior to Visit  Medication Sig Dispense Refill  . aspirin EC 81 MG tablet Take 81 mg by mouth daily.    . hydrochlorothiazide (HYDRODIURIL) 50 MG tablet Take 25 mg by mouth daily.     . isosorbide mononitrate (IMDUR) 30 MG 24 hr tablet Take 15 mg by mouth daily.     Marland Kitchen losartan (COZAAR) 100 MG tablet Take 100 mg by mouth at bedtime.     . nitroGLYCERIN (NITROSTAT) 0.4 MG SL tablet Place 0.4 mg under the tongue every 5 (five) minutes as needed for chest pain.    . pantoprazole (PROTONIX) 40 MG tablet Take 1 tablet (40 mg total) by mouth daily. Take 30-60 min before first meal of the day (Patient not taking: Reported on 06/19/2019) 30 tablet 2  . psyllium (METAMUCIL) 58.6 % powder Take 1 packet by mouth daily as needed (Constipation).     . tamsulosin (FLOMAX) 0.4 MG CAPS capsule Take 0.8 mg by mouth daily after supper.      No current facility-administered medications on file prior to visit.    PAST MEDICAL HISTORY: Past Medical History:  Diagnosis Date  . BPH (benign prostatic hyperplasia)   . GERD (gastroesophageal reflux disease)   . Hypertension     PAST SURGICAL HISTORY: Past Surgical History:  Procedure Laterality Date  . APPENDECTOMY    . BACK SURGERY  1993  . NECK SURGERY    . TONSILLECTOMY    . TOTAL KNEE ARTHROPLASTY Bilateral 2006/2007    FAMILY HISTORY:  The patient family history includes Hypertension in his mother.   SOCIAL HISTORY:  The patient  reports that he quit smoking about 54 years ago. His smoking use included cigarettes. He has never used smokeless tobacco. He reports that he does not drink alcohol or use drugs.  REVIEW OF SYSTEMS: Review of Systems  Constitution: Negative for chills and fever.  HENT: Negative for ear discharge, ear pain and nosebleeds.   Eyes: Negative for blurred vision and discharge.  Cardiovascular:  Positive for chest pain and dyspnea on exertion. Negative for claudication, leg swelling, near-syncope, orthopnea, palpitations, paroxysmal nocturnal dyspnea and syncope.  Respiratory: Positive for shortness of breath. Negative for cough.   Endocrine: Negative for polydipsia, polyphagia and polyuria.  Hematologic/Lymphatic: Negative for bleeding problem.  Skin: Negative for flushing and nail changes.  Musculoskeletal: Negative for muscle cramps, muscle weakness and myalgias.  Gastrointestinal: Negative for abdominal pain, dysphagia, hematemesis, hematochezia, melena, nausea and vomiting.  Neurological: Negative for dizziness, focal weakness and light-headedness.   PHYSICAL EXAM: Vitals with BMI 06/19/2019 12/07/2017 10/13/2017  Height '5\' 10"'$  5' 9.5" 5' 9.5"  Weight 291 lbs 295 lbs 6 oz 292 lbs  BMI 41.75 29.51 88.41  Systolic 660 630 160  Diastolic 61 78 70  Pulse 88 73 86   CONSTITUTIONAL: Well-developed and well-nourished. No acute distress.  SKIN: Skin is warm and dry. No rash noted. No cyanosis. No pallor. No jaundice HEAD: Normocephalic and atraumatic.  EYES: No scleral icterus MOUTH/THROAT: Moist oral membranes.  NECK: No JVD present. No thyromegaly noted. No carotid bruits  LYMPHATIC: No visible cervical adenopathy.  CHEST Normal respiratory effort. No intercostal retractions  LUNGS: Clear to auscultation bilaterally.  No stridor. No wheezes. No rales.  CARDIOVASCULAR: Regular positive S1-S2, no murmurs rubs or gallops appreciated ABDOMINAL: Obese, soft, nontender, nondistended, positive bowel sounds in all 4 quadrants, no apparent ascites.  EXTREMITIES: No peripheral edema  HEMATOLOGIC: No significant bruising NEUROLOGIC: Oriented to person, place, and time. Nonfocal. Normal muscle tone.  PSYCHIATRIC: Normal mood and affect. Normal behavior. Cooperative  CARDIAC DATABASE: EKG: 06/19/2019: Normal sinus rhythm, 82 bpm, poor R wave progression, possible old anterior and inferior  infarct, no active myocardial injury pattern.  Echocardiogram: None  Stress Testing: None  Heart Catheterization: None  LABORATORY DATA: CBC Latest Ref Rng & Units 12/06/2008  WBC 4.0 - 10.5 K/uL 5.0  Hemoglobin 13.0 - 17.0 g/dL 14.5  Hematocrit 39.0 - 52.0 % 42.8  Platelets 150 - 400 K/uL 184    CMP Latest Ref Rng & Units 12/06/2008  Glucose 70 - 99 mg/dL 98  BUN 6 - 23 mg/dL 19  Creatinine 0.4 - 1.5 mg/dL 0.87  Sodium 135 - 145 mEq/L 140  Potassium 3.5 - 5.1 mEq/L 3.8  Chloride 96 - 112 mEq/L 103  CO2 19 - 32 mEq/L 31  Calcium 8.4 - 10.5 mg/dL 9.5    Lipid Panel  No results found for: CHOL, TRIG, HDL, CHOLHDL, VLDL, LDLCALC, LDLDIRECT, LABVLDL  No results found for: HGBA1C No components found for: NTPROBNP No results found for: TSH  FINAL MEDICATION LIST END OF ENCOUNTER: Meds ordered this encounter  Medications  . metoprolol succinate (TOPROL XL) 25 MG 24 hr tablet    Sig: Take 1 tablet (25 mg total) by mouth in the morning. Hold if systolic blood pressure (top blood pressure number) less than 100 mmHg or heart rate less than 60 bpm (pulse).    Dispense:  90 tablet    Refill:  3  Medications Discontinued During This Encounter  Medication Reason  . amLODipine (NORVASC) 5 MG tablet Patient Discharge     Current Outpatient Medications:  .  aspirin EC 81 MG tablet, Take 81 mg by mouth daily., Disp: , Rfl:  .  hydrochlorothiazide (HYDRODIURIL) 50 MG tablet, Take 25 mg by mouth daily. , Disp: , Rfl:  .  isosorbide mononitrate (IMDUR) 30 MG 24 hr tablet, Take 15 mg by mouth daily. , Disp: , Rfl:  .  losartan (COZAAR) 100 MG tablet, Take 100 mg by mouth at bedtime. , Disp: , Rfl:  .  nitroGLYCERIN (NITROSTAT) 0.4 MG SL tablet, Place 0.4 mg under the tongue every 5 (five) minutes as needed for chest pain., Disp: , Rfl:  .  pantoprazole (PROTONIX) 40 MG tablet, Take 1 tablet (40 mg total) by mouth daily. Take 30-60 min before first meal of the day (Patient not taking:  Reported on 06/19/2019), Disp: 30 tablet, Rfl: 2 .  psyllium (METAMUCIL) 58.6 % powder, Take 1 packet by mouth daily as needed (Constipation). , Disp: , Rfl:  .  tamsulosin (FLOMAX) 0.4 MG CAPS capsule, Take 0.8 mg by mouth daily after supper. , Disp: , Rfl:  .  metoprolol succinate (TOPROL XL) 25 MG 24 hr tablet, Take 1 tablet (25 mg total) by mouth in the morning. Hold if systolic blood pressure (top blood pressure number) less than 100 mmHg or heart rate less than 60 bpm (pulse)., Disp: 90 tablet, Rfl: 3  IMPRESSION:    ICD-10-CM   1. Angina pectoris (HCC)  I20.9 CMP14+EGFR    Magnesium    Troponin I    B Nat Peptide    PCV ECHOCARDIOGRAM COMPLETE    metoprolol succinate (TOPROL XL) 25 MG 24 hr tablet  2. Essential hypertension  I10 EKG 12-Lead  3. Former smoker  Z87.891   4. Class 3 severe obesity due to excess calories without serious comorbidity with body mass index (BMI) of 40.0 to 44.9 in adult [facility])  E66.01    Z68.41      RECOMMENDATIONS: Justin Coleman is a 86 y.o. male whose past medical history and cardiac risk factors include: Hypertension, former smoker, advanced age, obesity.  Angina pectoris:  Patient has classic crescendo chest pain with effort related activities.  This has been longstanding for the last 6 months and no active chest pain or acute injury pattern on EKG.  I will order stat troponin and if abnormal patient will be instructed to go to the ER via EMS.  Patient's pretest probability for obstructive coronary artery disease is high given his multiple cardiovascular risk factors and symptoms.  I recommended the patient undergo invasive angiography with possible intervention.  Both the patient and wife are in agreement given his symptoms and risk factors.  The left heart catheterization procedure was explained to the patient and his wife in detail. The indication, alternatives, risks and benefits were reviewed. Complications including but not limited to  bleeding, infection, acute kidney injury, blood transfusion, heart rhythm disturbances, contrast (dye) reaction, damage to the arteries or nerves in the legs or hands, cerebrovascular accident, myocardial infarction, need for emergent bypass surgery, blood clots in the legs, possible need for emergent blood transfusion, and rarely death were reviewed and discussed with the patient and his wife. The patient and wife voices understanding and wishes to proceed.   In the meantime, I have instructed the patient to go to the ER sooner if his chest pain increases in intensity, frequency, and/or  duration.  Echocardiogram will be ordered to evaluate for structural heart disease and left ventricular systolic function.  Continue aspirin 81 mg p.o. daily.  Patient has been started on Imdur by his PCP.  He was unable to tolerate a higher dose of Imdur.  We will continue 15 mg p.o. daily.  Start Toprol-XL 25 mg p.o. daily.  He also has sublingual nitroglycerin tablets to use on a as needed basis.  Medication profile discussed.  He is aware not to use nitrates with erectile dysfunction/BPH medications such as Viagra/sildenafil, Levitra/vardenafil, Cialis/tadalafil as there are drug to drug interactions that can worsen morbidity mortality and even cause death.  I have instructed him not to overexert himself until the heart catheterization is complete.  And if any worsening of symptoms he needs to go to the closest hospital via EMS.  Dyspnea on exertion:  Concerning for anginal equivalent.  Clinically patient is not in heart failure.  Will check CMP, CBC, BNP.  See above  Former smoker: Educated on the importance of continued smoking cessation.  Patient needs a fasting lipid profile will check it at the next office visit.  Orders Placed This Encounter  Procedures  . CMP14+EGFR  . Magnesium  . Troponin I  . B Nat Peptide  . EKG 12-Lead  . PCV ECHOCARDIOGRAM COMPLETE   I called the patient back  after office hours to let him know the preliminary blood work results.  Troponin is negative x1.  He was thankful for a call back.  --Continue cardiac medications as reconciled in final medication list. --Return in about 2 weeks (around 07/03/2019) for re-evaluation of symptoms.. Or sooner if needed. --Continue follow-up with your primary care physician regarding the management of your other chronic comorbid conditions.  Patient's questions and concerns were addressed to his satisfaction. He voices understanding of the instructions provided during this encounter.   During this visit I reviewed and updated: Tobacco history  allergies medication reconciliation  medical history  surgical history  family history  social history.  Total encounter time 64 minutes. *Total Encounter Time as defined by the Centers for Medicare and Medicaid Services includes, in addition to the face-to-face time of a patient visit (documented in the note above) non-face-to-face time: obtaining and reviewing outside history, ordering and reviewing medications, tests or procedures, care coordination (communications with other health care professionals or caregivers) and documentation in the medical record.  This note was created using a voice recognition software as a result there may be grammatical errors inadvertently enclosed that do not reflect the nature of this encounter. Every attempt is made to correct such errors.  Rex Kras, [place], [facility]  Pager: [phone] Office: [phone]

## 2019-06-19 NOTE — Telephone Encounter (Signed)
Chasity from [facility] called to give an update on patient's recent lab results:  Ordering Provider: Sunit Tolia, DO  GLU   114 BUN   21 CREAT   0.84 EGFR -NON AA   80 EGFR AA   93 BUN/CREAT RATIO   25 SODIUM   140 POTASSIUM   4.1 CHLORIDE   101 CARBON DIOXIDE TOTAL  25 CALCIUM    9.6 PROTIEN    6.7 ALBUMIN    4.3 GLOBULIN    2.4 AG  RATIO   1.8 ALKALINE PHOSPHATE   64 AST   17 ALT   16 TRIPONIN  <0.01 B-TYPE  NATRIURETIC PEPTIDE  20.03 MAGNESIUM  2.1

## 2019-06-19 NOTE — H&P (View-Only) (Signed)
Justin Coleman Date of Birth: [birth year] MRN: [id number] Primary Care Provider:Elkins, Curt Jews, MD Former Cardiology Providers: Dr. Wynonia Lawman  Primary Cardiologist: Rex Kras, DO (established care 06/19/2019)  Date: 06/19/19  REASON FOR CONSULT: Chest tightness and anterior infarct  Chief Complaint  Patient presents with   New Patient (Initial Visit)   Chest Pain   Shortness of Breath    REQUESTING PHYSICIAN:  [place] Downing, MD [place],  [place] [address]  HPI  Justin Coleman is a 86 y.o. male who presents to the office with a chief complaint of " chest pain and shortness of breath." Patient's past medical history and cardiac risk factors include: Hypertension, former smoker, advanced age, obesity.   Patient is accompanied by his wife at today's office visit.  Patient was referred to the office at the request of his primary care for evaluation of chest pain.  Chest pain and shortness of breath: Patient states that he has been having chest discomfort for the past 6 months and it has been getting progressively worse.  The pain is located substernally, effort related, improved with rest.  The intensity of the pain at its worst is 7 out of 10.  He has been given sublingual nitroglycerin tablets but has not used them.  No associated symptoms of nausea, vomiting, diaphoresis.  He does have at times effort related dyspnea which has become more prominent.  He denies lower extremity swelling, orthopnea, or paroxysmal nocturnal dyspnea.  He does states that his overall functional capacity has decreased since 2012.  When asked why he has delayed cardiovascular work-up no clear explanation.  Denies prior history of coronary artery disease, myocardial infarction, congestive heart failure, deep venous thrombosis, pulmonary embolism, stroke, transient ischemic attack.  FUNCTIONAL STATUS: Does his best to maintain his vegetable garden (but has been difficult over  the last 6 month).    ALLERGIES: No Known Allergies  MEDICATION LIST PRIOR TO VISIT: Current Outpatient Medications on File Prior to Visit  Medication Sig Dispense Refill   aspirin EC 81 MG tablet Take 81 mg by mouth daily.     hydrochlorothiazide (HYDRODIURIL) 50 MG tablet Take 25 mg by mouth daily.      isosorbide mononitrate (IMDUR) 30 MG 24 hr tablet Take 15 mg by mouth daily.      losartan (COZAAR) 100 MG tablet Take 100 mg by mouth at bedtime.      nitroGLYCERIN (NITROSTAT) 0.4 MG SL tablet Place 0.4 mg under the tongue every 5 (five) minutes as needed for chest pain.     pantoprazole (PROTONIX) 40 MG tablet Take 1 tablet (40 mg total) by mouth daily. Take 30-60 min before first meal of the day (Patient not taking: Reported on 06/19/2019) 30 tablet 2   psyllium (METAMUCIL) 58.6 % powder Take 1 packet by mouth daily as needed (Constipation).      tamsulosin (FLOMAX) 0.4 MG CAPS capsule Take 0.8 mg by mouth daily after supper.      No current facility-administered medications on file prior to visit.    PAST MEDICAL HISTORY: Past Medical History:  Diagnosis Date   BPH (benign prostatic hyperplasia)    GERD (gastroesophageal reflux disease)    Hypertension     PAST SURGICAL HISTORY: Past Surgical History:  Procedure Laterality Date   APPENDECTOMY     BACK SURGERY  1993   NECK SURGERY     TONSILLECTOMY     TOTAL KNEE ARTHROPLASTY Bilateral 2006/2007    FAMILY HISTORY:  The patient family history includes Hypertension in his mother.   SOCIAL HISTORY:  The patient  reports that he quit smoking about 54 years ago. His smoking use included cigarettes. He has never used smokeless tobacco. He reports that he does not drink alcohol or use drugs.  REVIEW OF SYSTEMS: Review of Systems  Constitution: Negative for chills and fever.  HENT: Negative for ear discharge, ear pain and nosebleeds.   Eyes: Negative for blurred vision and discharge.  Cardiovascular:  Positive for chest pain and dyspnea on exertion. Negative for claudication, leg swelling, near-syncope, orthopnea, palpitations, paroxysmal nocturnal dyspnea and syncope.  Respiratory: Positive for shortness of breath. Negative for cough.   Endocrine: Negative for polydipsia, polyphagia and polyuria.  Hematologic/Lymphatic: Negative for bleeding problem.  Skin: Negative for flushing and nail changes.  Musculoskeletal: Negative for muscle cramps, muscle weakness and myalgias.  Gastrointestinal: Negative for abdominal pain, dysphagia, hematemesis, hematochezia, melena, nausea and vomiting.  Neurological: Negative for dizziness, focal weakness and light-headedness.   PHYSICAL EXAM: Vitals with BMI 06/19/2019 12/07/2017 10/13/2017  Height '5\' 10"'$  5' 9.5" 5' 9.5"  Weight 291 lbs 295 lbs 6 oz 292 lbs  BMI 41.75 72.53 66.44  Systolic 034 742 595  Diastolic 61 78 70  Pulse 88 73 86   CONSTITUTIONAL: Well-developed and well-nourished. No acute distress.  SKIN: Skin is warm and dry. No rash noted. No cyanosis. No pallor. No jaundice HEAD: Normocephalic and atraumatic.  EYES: No scleral icterus MOUTH/THROAT: Moist oral membranes.  NECK: No JVD present. No thyromegaly noted. No carotid bruits  LYMPHATIC: No visible cervical adenopathy.  CHEST Normal respiratory effort. No intercostal retractions  LUNGS: Clear to auscultation bilaterally.  No stridor. No wheezes. No rales.  CARDIOVASCULAR: Regular positive S1-S2, no murmurs rubs or gallops appreciated ABDOMINAL: Obese, soft, nontender, nondistended, positive bowel sounds in all 4 quadrants, no apparent ascites.  EXTREMITIES: No peripheral edema  HEMATOLOGIC: No significant bruising NEUROLOGIC: Oriented to person, place, and time. Nonfocal. Normal muscle tone.  PSYCHIATRIC: Normal mood and affect. Normal behavior. Cooperative  CARDIAC DATABASE: EKG: 06/19/2019: Normal sinus rhythm, 82 bpm, poor R wave progression, possible old anterior and inferior  infarct, no active myocardial injury pattern.  Echocardiogram: None  Stress Testing: None  Heart Catheterization: None  LABORATORY DATA: CBC Latest Ref Rng & Units 12/06/2008  WBC 4.0 - 10.5 K/uL 5.0  Hemoglobin 13.0 - 17.0 g/dL 14.5  Hematocrit 39.0 - 52.0 % 42.8  Platelets 150 - 400 K/uL 184    CMP Latest Ref Rng & Units 12/06/2008  Glucose 70 - 99 mg/dL 98  BUN 6 - 23 mg/dL 19  Creatinine 0.4 - 1.5 mg/dL 0.87  Sodium 135 - 145 mEq/L 140  Potassium 3.5 - 5.1 mEq/L 3.8  Chloride 96 - 112 mEq/L 103  CO2 19 - 32 mEq/L 31  Calcium 8.4 - 10.5 mg/dL 9.5    Lipid Panel  No results found for: CHOL, TRIG, HDL, CHOLHDL, VLDL, LDLCALC, LDLDIRECT, LABVLDL  No results found for: HGBA1C No components found for: NTPROBNP No results found for: TSH  FINAL MEDICATION LIST END OF ENCOUNTER: Meds ordered this encounter  Medications   metoprolol succinate (TOPROL XL) 25 MG 24 hr tablet    Sig: Take 1 tablet (25 mg total) by mouth in the morning. Hold if systolic blood pressure (top blood pressure number) less than 100 mmHg or heart rate less than 60 bpm (pulse).    Dispense:  90 tablet    Refill:  3  Medications Discontinued During This Encounter  Medication Reason   amLODipine (NORVASC) 5 MG tablet Patient Discharge     Current Outpatient Medications:    aspirin EC 81 MG tablet, Take 81 mg by mouth daily., Disp: , Rfl:    hydrochlorothiazide (HYDRODIURIL) 50 MG tablet, Take 25 mg by mouth daily. , Disp: , Rfl:    isosorbide mononitrate (IMDUR) 30 MG 24 hr tablet, Take 15 mg by mouth daily. , Disp: , Rfl:    losartan (COZAAR) 100 MG tablet, Take 100 mg by mouth at bedtime. , Disp: , Rfl:    nitroGLYCERIN (NITROSTAT) 0.4 MG SL tablet, Place 0.4 mg under the tongue every 5 (five) minutes as needed for chest pain., Disp: , Rfl:    pantoprazole (PROTONIX) 40 MG tablet, Take 1 tablet (40 mg total) by mouth daily. Take 30-60 min before first meal of the day (Patient not taking:  Reported on 06/19/2019), Disp: 30 tablet, Rfl: 2   psyllium (METAMUCIL) 58.6 % powder, Take 1 packet by mouth daily as needed (Constipation). , Disp: , Rfl:    tamsulosin (FLOMAX) 0.4 MG CAPS capsule, Take 0.8 mg by mouth daily after supper. , Disp: , Rfl:    metoprolol succinate (TOPROL XL) 25 MG 24 hr tablet, Take 1 tablet (25 mg total) by mouth in the morning. Hold if systolic blood pressure (top blood pressure number) less than 100 mmHg or heart rate less than 60 bpm (pulse)., Disp: 90 tablet, Rfl: 3  IMPRESSION:    ICD-10-CM   1. Angina pectoris (HCC)  I20.9 CMP14+EGFR    Magnesium    Troponin I    B Nat Peptide    PCV ECHOCARDIOGRAM COMPLETE    metoprolol succinate (TOPROL XL) 25 MG 24 hr tablet  2. Essential hypertension  I10 EKG 12-Lead  3. Former smoker  Z87.891   4. Class 3 severe obesity due to excess calories without serious comorbidity with body mass index (BMI) of 40.0 to 44.9 in adult [facility])  E66.01    Z68.41      RECOMMENDATIONS: Justin Coleman is a 86 y.o. male whose past medical history and cardiac risk factors include: Hypertension, former smoker, advanced age, obesity.  Angina pectoris:  Patient has classic crescendo chest pain with effort related activities.  This has been longstanding for the last 6 months and no active chest pain or acute injury pattern on EKG.  I will order stat troponin and if abnormal patient will be instructed to go to the ER via EMS.  Patient's pretest probability for obstructive coronary artery disease is high given his multiple cardiovascular risk factors and symptoms.  I recommended the patient undergo invasive angiography with possible intervention.  Both the patient and wife are in agreement given his symptoms and risk factors.  The left heart catheterization procedure was explained to the patient and his wife in detail. The indication, alternatives, risks and benefits were reviewed. Complications including but not limited to  bleeding, infection, acute kidney injury, blood transfusion, heart rhythm disturbances, contrast (dye) reaction, damage to the arteries or nerves in the legs or hands, cerebrovascular accident, myocardial infarction, need for emergent bypass surgery, blood clots in the legs, possible need for emergent blood transfusion, and rarely death were reviewed and discussed with the patient and his wife. The patient and wife voices understanding and wishes to proceed.   In the meantime, I have instructed the patient to go to the ER sooner if his chest pain increases in intensity, frequency, and/or  duration.  Echocardiogram will be ordered to evaluate for structural heart disease and left ventricular systolic function.  Continue aspirin 81 mg p.o. daily.  Patient has been started on Imdur by his PCP.  He was unable to tolerate a higher dose of Imdur.  We will continue 15 mg p.o. daily.  Start Toprol-XL 25 mg p.o. daily.  He also has sublingual nitroglycerin tablets to use on a as needed basis.  Medication profile discussed.  He is aware not to use nitrates with erectile dysfunction/BPH medications such as Viagra/sildenafil, Levitra/vardenafil, Cialis/tadalafil as there are drug to drug interactions that can worsen morbidity mortality and even cause death.  I have instructed him not to overexert himself until the heart catheterization is complete.  And if any worsening of symptoms he needs to go to the closest hospital via EMS.  Dyspnea on exertion:  Concerning for anginal equivalent.  Clinically patient is not in heart failure.  Will check CMP, CBC, BNP.  See above  Former smoker: Educated on the importance of continued smoking cessation.  Patient needs a fasting lipid profile will check it at the next office visit.  Orders Placed This Encounter  Procedures   CMP14+EGFR   Magnesium   Troponin I   B Nat Peptide   EKG 12-Lead   PCV ECHOCARDIOGRAM COMPLETE   I called the patient back  after office hours to let him know the preliminary blood work results.  Troponin is negative x1.  He was thankful for a call back.  --Continue cardiac medications as reconciled in final medication list. --Return in about 2 weeks (around 07/03/2019) for re-evaluation of symptoms.. Or sooner if needed. --Continue follow-up with your primary care physician regarding the management of your other chronic comorbid conditions.  Patient's questions and concerns were addressed to his satisfaction. He voices understanding of the instructions provided during this encounter.   During this visit I reviewed and updated: Tobacco history   allergies  medication reconciliation   medical history   surgical history   family history   social history.  Total encounter time 64 minutes. *Total Encounter Time as defined by the Centers for Medicare and Medicaid Services includes, in addition to the face-to-face time of a patient visit (documented in the note above) non-face-to-face time: obtaining and reviewing outside history, ordering and reviewing medications, tests or procedures, care coordination (communications with other health care professionals or caregivers) and documentation in the medical record.  This note was created using a voice recognition software as a result there may be grammatical errors inadvertently enclosed that do not reflect the nature of this encounter. Every attempt is made to correct such errors.  Rex Kras, [place], [facility]  Pager: [phone] Office: [phone]

## 2019-06-19 NOTE — Telephone Encounter (Signed)
Informed the patient of his preliminary blood work results.  Troponins are undetected.  Kidney function is stable.

## 2019-06-20 LAB — CMP14+EGFR
ALT: 16 IU/L (ref 0–44)
AST: 17 IU/L (ref 0–40)
Albumin/Globulin Ratio: 1.8 (ref 1.2–2.2)
Albumin: 4.3 g/dL (ref 3.6–4.6)
Alkaline Phosphatase: 64 IU/L (ref 39–117)
BUN/Creatinine Ratio: 25 — ABNORMAL HIGH (ref 10–24)
BUN: 21 mg/dL (ref 8–27)
Bilirubin Total: 0.3 mg/dL (ref 0.0–1.2)
CO2: 25 mmol/L (ref 20–29)
Calcium: 9.6 mg/dL (ref 8.6–10.2)
Chloride: 101 mmol/L (ref 96–106)
Creatinine, Ser: 0.84 mg/dL (ref 0.76–1.27)
GFR calc Af Amer: 93 mL/min/{1.73_m2} (ref >59)
GFR calc non Af Amer: 80 mL/min/{1.73_m2} (ref >59)
Globulin, Total: 2.4 g/dL (ref 1.5–4.5)
Glucose: 114 mg/dL — ABNORMAL HIGH (ref 65–99)
Potassium: 4.1 mmol/L (ref 3.5–5.2)
Sodium: 140 mmol/L (ref 134–144)
Total Protein: 6.7 g/dL (ref 6.0–8.5)

## 2019-06-20 LAB — BRAIN NATRIURETIC PEPTIDE: BNP: 20.3 pg/mL (ref 0.0–100.0)

## 2019-06-20 LAB — MAGNESIUM: Magnesium: 2.1 mg/dL (ref 1.6–2.3)

## 2019-06-20 LAB — TROPONIN I: Troponin I: <0.01 ng/mL (ref 0.00–0.04)

## 2019-06-22 ENCOUNTER — Ambulatory Visit: Payer: Medicare PPO

## 2019-06-22 ENCOUNTER — Other Ambulatory Visit: Payer: Self-pay

## 2019-06-22 ENCOUNTER — Telehealth: Payer: Self-pay

## 2019-06-22 DIAGNOSIS — I209 Angina pectoris, unspecified: Secondary | ICD-10-CM

## 2019-06-22 NOTE — Telephone Encounter (Signed)
Please call the patient and inform him that I am aware. ST

## 2019-06-23 ENCOUNTER — Other Ambulatory Visit (HOSPITAL_COMMUNITY)
Admission: RE | Admit: 2019-06-23 | Discharge: 2019-06-23 | Disposition: A | Discharge status: Home / Self Care | Payer: Medicare PPO | Source: Ambulatory Visit | Attending: Cardiology | Admitting: Cardiology

## 2019-06-23 DIAGNOSIS — Z01812 Encounter for preprocedural laboratory examination: Secondary | ICD-10-CM | POA: Diagnosis present

## 2019-06-23 DIAGNOSIS — Z20822 Contact with and (suspected) exposure to covid-19: Secondary | ICD-10-CM | POA: Diagnosis not present

## 2019-06-23 LAB — SARS CORONAVIRUS 2 (TAT 6-24 HRS): SARS Coronavirus 2: NEGATIVE

## 2019-06-27 ENCOUNTER — Encounter (HOSPITAL_COMMUNITY)
Admission: RE | Disposition: A | Discharge status: Home / Self Care | Payer: Self-pay | Source: Home / Self Care | Attending: Cardiology

## 2019-06-27 ENCOUNTER — Other Ambulatory Visit: Payer: Self-pay

## 2019-06-27 ENCOUNTER — Ambulatory Visit (HOSPITAL_COMMUNITY)
Admission: RE | Admit: 2019-06-27 | Discharge: 2019-06-27 | Disposition: A | Discharge status: Home / Self Care | Payer: Medicare PPO | Attending: Cardiology | Admitting: Cardiology

## 2019-06-27 DIAGNOSIS — I25119 Atherosclerotic heart disease of native coronary artery with unspecified angina pectoris: Secondary | ICD-10-CM | POA: Diagnosis not present

## 2019-06-27 DIAGNOSIS — R9431 Abnormal electrocardiogram [ECG] [EKG]: Secondary | ICD-10-CM | POA: Diagnosis present

## 2019-06-27 DIAGNOSIS — Z6841 Body Mass Index (BMI) 40.0 and over, adult: Secondary | ICD-10-CM | POA: Diagnosis not present

## 2019-06-27 DIAGNOSIS — N400 Enlarged prostate without lower urinary tract symptoms: Secondary | ICD-10-CM | POA: Diagnosis not present

## 2019-06-27 DIAGNOSIS — I10 Essential (primary) hypertension: Secondary | ICD-10-CM | POA: Insufficient documentation

## 2019-06-27 DIAGNOSIS — Z8249 Family history of ischemic heart disease and other diseases of the circulatory system: Secondary | ICD-10-CM | POA: Diagnosis not present

## 2019-06-27 DIAGNOSIS — Z79899 Other long term (current) drug therapy: Secondary | ICD-10-CM | POA: Diagnosis not present

## 2019-06-27 DIAGNOSIS — I209 Angina pectoris, unspecified: Secondary | ICD-10-CM | POA: Diagnosis present

## 2019-06-27 DIAGNOSIS — K219 Gastro-esophageal reflux disease without esophagitis: Secondary | ICD-10-CM | POA: Diagnosis not present

## 2019-06-27 DIAGNOSIS — Z87891 Personal history of nicotine dependence: Secondary | ICD-10-CM | POA: Diagnosis not present

## 2019-06-27 DIAGNOSIS — Z7982 Long term (current) use of aspirin: Secondary | ICD-10-CM | POA: Diagnosis not present

## 2019-06-27 HISTORY — PX: LEFT HEART CATH AND CORONARY ANGIOGRAPHY: CATH118249

## 2019-06-27 LAB — CBC
HCT: 42 % (ref 39.0–52.0)
Hemoglobin: 14.1 g/dL (ref 13.0–17.0)
MCH: 31.4 pg (ref 26.0–34.0)
MCHC: 33.6 g/dL (ref 30.0–36.0)
MCV: 93.5 fL (ref 80.0–100.0)
Platelets: 216 10*3/uL (ref 150–400)
RBC: 4.49 MIL/uL (ref 4.22–5.81)
RDW: 13.9 % (ref 11.5–15.5)
WBC: 5.2 10*3/uL (ref 4.0–10.5)
nRBC: 0 % (ref 0.0–0.2)

## 2019-06-27 SURGERY — LEFT HEART CATH AND CORONARY ANGIOGRAPHY
Anesthesia: LOCAL

## 2019-06-27 MED ORDER — SODIUM CHLORIDE 0.9 % WEIGHT BASED INFUSION: 1.0000 mL/kg/h | INTRAVENOUS | Status: AC

## 2019-06-27 MED ORDER — LIDOCAINE HCL (PF) 1 % IJ SOLN
INTRAMUSCULAR | Status: AC
  Filled 2019-06-27: qty 30

## 2019-06-27 MED ORDER — MIDAZOLAM HCL 2 MG/2ML IJ SOLN
INTRAMUSCULAR | Status: AC
  Filled 2019-06-27: qty 2

## 2019-06-27 MED ORDER — ACETAMINOPHEN 325 MG PO TABS: 650.0000 mg | ORAL_TABLET | ORAL | Status: DC | PRN

## 2019-06-27 MED ORDER — SODIUM CHLORIDE 0.9 % WEIGHT BASED INFUSION: 1.0000 mL/kg/h | INTRAVENOUS | Status: DC

## 2019-06-27 MED ORDER — ONDANSETRON HCL 4 MG/2ML IJ SOLN: 4.0000 mg | Freq: Four times a day (QID) | INTRAMUSCULAR | Status: DC | PRN

## 2019-06-27 MED ORDER — HEPARIN (PORCINE) IN NACL 1000-0.9 UT/500ML-% IV SOLN
INTRAVENOUS | Status: AC
  Filled 2019-06-27: qty 500

## 2019-06-27 MED ORDER — LIDOCAINE HCL (PF) 1 % IJ SOLN
INTRAMUSCULAR | Status: DC | PRN
  Administered 2019-06-27: 3 mL
  Administered 2019-06-27: 2 mL

## 2019-06-27 MED ORDER — SODIUM CHLORIDE 0.9 % IV SOLN: 250.0000 mL | INTRAVENOUS | Status: DC | PRN

## 2019-06-27 MED ORDER — FENTANYL CITRATE (PF) 100 MCG/2ML IJ SOLN
INTRAMUSCULAR | Status: AC
  Filled 2019-06-27: qty 2

## 2019-06-27 MED ORDER — NITROGLYCERIN 1 MG/10 ML FOR IR/CATH LAB
INTRA_ARTERIAL | Status: DC | PRN
  Administered 2019-06-27: 200 ug via INTRACORONARY

## 2019-06-27 MED ORDER — SODIUM CHLORIDE 0.9% FLUSH: 3.0000 mL | INTRAVENOUS | Status: DC | PRN

## 2019-06-27 MED ORDER — NITROGLYCERIN 1 MG/10 ML FOR IR/CATH LAB
INTRA_ARTERIAL | Status: AC
  Filled 2019-06-27: qty 10

## 2019-06-27 MED ORDER — HEPARIN (PORCINE) IN NACL 1000-0.9 UT/500ML-% IV SOLN
INTRAVENOUS | Status: DC | PRN
  Administered 2019-06-27: 500 mL

## 2019-06-27 MED ORDER — SODIUM CHLORIDE 0.9 % WEIGHT BASED INFUSION
3.0000 mL/kg/h | INTRAVENOUS | Status: AC
  Administered 2019-06-27: 11:00:00 3 mL/kg/h via INTRAVENOUS

## 2019-06-27 MED ORDER — SODIUM CHLORIDE 0.9% FLUSH: 3.0000 mL | Freq: Two times a day (BID) | INTRAVENOUS | Status: DC

## 2019-06-27 MED ORDER — FENTANYL CITRATE (PF) 100 MCG/2ML IJ SOLN
INTRAMUSCULAR | Status: DC | PRN
  Administered 2019-06-27: 25 ug via INTRAVENOUS

## 2019-06-27 MED ORDER — AMLODIPINE BESYLATE 5 MG PO TABS: 5.0000 mg | ORAL_TABLET | Freq: Every day | ORAL | 2 refills | Status: DC

## 2019-06-27 MED ORDER — MIDAZOLAM HCL 2 MG/2ML IJ SOLN
INTRAMUSCULAR | Status: DC | PRN
  Administered 2019-06-27: 2 mg via INTRAVENOUS

## 2019-06-27 MED ORDER — HEPARIN SODIUM (PORCINE) 1000 UNIT/ML IJ SOLN
INTRAMUSCULAR | Status: DC | PRN
  Administered 2019-06-27: 6000 [IU] via INTRAVENOUS

## 2019-06-27 MED ORDER — IOHEXOL 350 MG/ML SOLN
INTRAVENOUS | Status: DC | PRN
  Administered 2019-06-27: 13:00:00 45 mL via INTRA_ARTERIAL

## 2019-06-27 MED ORDER — VERAPAMIL HCL 2.5 MG/ML IV SOLN
INTRAVENOUS | Status: DC | PRN
  Administered 2019-06-27: 12:00:00 5 mL via INTRA_ARTERIAL

## 2019-06-27 SURGICAL SUPPLY — 13 items
CATH INFINITI JR4 5F (CATHETERS) ×1 IMPLANT
CATH OPTITORQUE TIG 4.0 5F (CATHETERS) ×1 IMPLANT
DEVICE RAD COMP TR BAND LRG (VASCULAR PRODUCTS) ×1 IMPLANT
GLIDESHEATH SLEND A-KIT 6F 22G (SHEATH) ×2 IMPLANT
GUIDEWIRE INQWIRE 1.5J.035X260 (WIRE) IMPLANT
INQWIRE 1.5J .035X260CM (WIRE) ×2
KIT HEART LEFT (KITS) ×2 IMPLANT
PACK CARDIAC CATHETERIZATION (CUSTOM PROCEDURE TRAY) ×2 IMPLANT
SHEATH PINNACLE 5F 10CM (SHEATH) ×1 IMPLANT
SHEATH PROBE COVER 6X72 (BAG) ×1 IMPLANT
TRANSDUCER W/STOPCOCK (MISCELLANEOUS) ×2 IMPLANT
TUBING CIL FLEX 10 FLL-RA (TUBING) ×2 IMPLANT
WIRE EMERALD 3MM-J .035X150CM (WIRE) ×1 IMPLANT

## 2019-06-27 NOTE — Interval H&P Note (Signed)
History and Physical Interval Note:  06/27/2019 12:02 PM  Justin Coleman  has presented today for surgery, with the diagnosis of angina.  The various methods of treatment have been discussed with the patient and family. After consideration of risks, benefits and other options for treatment, the patient has consented to  Procedure(s): LEFT HEART CATH AND CORONARY ANGIOGRAPHY (N/A)  and possible angioplasty as a surgical intervention.  The patient's history has been reviewed, patient examined, no change in status, stable for surgery.  I have reviewed the patient's chart and labs.  Questions were answered to the patient's satisfaction.    Patient has class III angina pectoris and progressive angina pectoris with abnormal EKG with high probability for underlying CAD, hence stress test not performed. He has not had any chest pain or anginal symptoms since recent emergency room evaluation. All questions answered and proceeding.Cath Lab Visit (complete for each Cath Lab visit)  Clinical Evaluation Leading to the Procedure:   ACS: No.  Non-ACS:    Anginal Classification: CCS III  Anti-ischemic medical therapy: Minimal Therapy (1 class of medications)  Non-Invasive Test Results: No non-invasive testing performed  Prior CABG: No previous CABG   Yates Decamp

## 2019-06-27 NOTE — Progress Notes (Signed)
Relayed information to patient's spouse. Spouse voiced understanding and will let patient know.

## 2019-06-27 NOTE — Discharge Instructions (Signed)
Radial Site Care  This sheet gives you information about how to care for yourself after your procedure. Your health care provider may also give you more specific instructions. If you have problems or questions, contact your health care provider. What can I expect after the procedure? After the procedure, it is common to have:  Bruising and tenderness at the catheter insertion area. Follow these instructions at home: Medicines  Take over-the-counter and prescription medicines only as told by your health care provider. Insertion site care  Follow instructions from your health care provider about how to take care of your insertion site. Make sure you: ? Wash your hands with soap and water before you change your bandage (dressing). If soap and water are not available, use hand sanitizer. ? Change your dressing as told by your health care provider. ? Leave stitches (sutures), skin glue, or adhesive strips in place. These skin closures may need to stay in place for 2 weeks or longer. If adhesive strip edges start to loosen and curl up, you may trim the loose edges. Do not remove adhesive strips completely unless your health care provider tells you to do that.  Check your insertion site every day for signs of infection. Check for: ? Redness, swelling, or pain. ? Fluid or blood. ? Pus or a bad smell. ? Warmth.  Do not take baths, swim, or use a hot tub until your health care provider approves.  You may shower 24-48 hours after the procedure, or as directed by your health care provider. ? Remove the dressing and gently wash the site with plain soap and water. ? Pat the area dry with a clean towel. ? Do not rub the site. That could cause bleeding.  Do not apply powder or lotion to the site. Activity   For 24 hours after the procedure, or as directed by your health care provider: ? Do not flex or bend the affected arm. ? Do not push or pull heavy objects with the affected arm. ? Do not  drive yourself home from the hospital or clinic. You may drive 24 hours after the procedure unless your health care provider tells you not to. ? Do not operate machinery or power tools.  Do not lift anything that is heavier than 10 lb (4.5 kg), or the limit that you are told, until your health care provider says that it is safe.  Ask your health care provider when it is okay to: ? Return to work or school. ? Resume usual physical activities or sports. ? Resume sexual activity. General instructions  If the catheter site starts to bleed, raise your arm and put firm pressure on the site. If the bleeding does not stop, get help right away. This is a medical emergency.  If you went home on the same day as your procedure, a responsible adult should be with you for the first 24 hours after you arrive home.  Keep all follow-up visits as told by your health care provider. This is important. Contact a health care provider if:  You have a fever.  You have redness, swelling, or yellow drainage around your insertion site. Get help right away if:  You have unusual pain at the radial site.  The catheter insertion area swells very fast.  The insertion area is bleeding, and the bleeding does not stop when you hold steady pressure on the area.  Your arm or hand becomes pale, cool, tingly, or numb. These symptoms may represent a serious problem   that is an emergency. Do not wait to see if the symptoms will go away. Get medical help right away. Call your local emergency services (911 in the [place] Do not drive yourself to the hospital. Summary  After the procedure, it is common to have bruising and tenderness at the site.  Follow instructions from your health care provider about how to take care of your radial site wound. Check the wound every day for signs of infection.  Do not lift anything that is heavier than 10 lb (4.5 kg), or the limit that you are told, until your health care provider says  that it is safe. This information is not intended to replace advice given to you by your health care provider. Make sure you discuss any questions you have with your health care provider. Document Revised: 03/24/2017 Document Reviewed: 03/24/2017 Elsevier Patient Education  2020 [employer]  

## 2019-06-27 NOTE — Progress Notes (Signed)
TR BAND REMOVAL  LOCATION:    right ulner  DEFLATED PER PROTOCOL:    Yes.    TIME BAND OFF / DRESSING APPLIED:    1600   SITE UPON ARRIVAL:    Level 0  SITE AFTER BAND REMOVAL:    Level 0  CIRCULATION SENSATION AND MOVEMENT:    Within Normal Limits   Yes.    COMMENTS:   TRB removed/ dsg applied

## 2019-07-04 ENCOUNTER — Other Ambulatory Visit: Payer: Self-pay

## 2019-07-04 ENCOUNTER — Ambulatory Visit: Payer: Medicare PPO | Admitting: Cardiology

## 2019-07-04 ENCOUNTER — Encounter: Payer: Self-pay | Admitting: Cardiology

## 2019-07-04 VITALS — BP 137/66 | HR 66 | Ht 70.0 in | Wt 295.0 lb

## 2019-07-04 DIAGNOSIS — Z87891 Personal history of nicotine dependence: Secondary | ICD-10-CM

## 2019-07-04 DIAGNOSIS — I208 Other forms of angina pectoris: Secondary | ICD-10-CM

## 2019-07-04 DIAGNOSIS — I10 Essential (primary) hypertension: Secondary | ICD-10-CM

## 2019-07-04 DIAGNOSIS — Z712 Person consulting for explanation of examination or test findings: Secondary | ICD-10-CM

## 2019-07-04 MED ORDER — AMLODIPINE BESYLATE 10 MG PO TABS: 10.0000 mg | ORAL_TABLET | Freq: Every day | ORAL | 0 refills | Status: DC

## 2019-07-04 MED ORDER — METOPROLOL SUCCINATE ER 50 MG PO TB24: 50.0000 mg | ORAL_TABLET | Freq: Every morning | ORAL | 1 refills | Status: DC

## 2019-07-04 NOTE — Progress Notes (Signed)
Justin Coleman Date of Birth: August 17, 1932 MRN: [id number] Primary Care Provider:Elkins, Curly Rim, MD Former Cardiology Providers: Dr. Donnie Aho  Primary Cardiologist: Tessa Lerner, DO, [facility] (established care 06/19/2019)  Date: 07/04/19 Last Office Visit: 06/19/2019  Chief Complaint  Patient presents with  . Chest Pain    follow up, pt has no complaints   HPI  Justin Coleman is a 86 y.o. male who presents to the office with a chief complaint of " chest pain and review test results" Patient's past medical history and cardiac risk factors include: Nonobstructive CAD, microvascular angina, hypertension, former smoker, advanced age, obesity.   Patient is accompanied by his wife at today's office visit.  Patient was referred to the office back in April 2021 at the request of his primary care for evaluation of chest pain and shortness of breath.  During his initial visit patient symptoms were very concerning for anginal equivalent as the chest pain was substernally located associated with effort related activity and improving with rest.  He also noted decrease in physical endurance.  Given his symptoms and cardiovascular risk factors she was recommended to undergo angiography with possible intervention.  He underwent left heart catheterization on June 27, 2019 findings noted below.  But essentially he had nonobstructive CAD and findings suggestive of underlying microvascular angina and possible coronary spasm.  Patient presents to the office today after his left heart catheterization to review the results and to reevaluate his symptoms.  Since the last office visit patient states that his symptoms have improved. He has not used sublingual nitro.   Denies prior history of myocardial infarction, congestive heart failure, deep venous thrombosis, pulmonary embolism, stroke, transient ischemic attack.  FUNCTIONAL STATUS: Does his best to maintain his vegetable garden (but has been difficult over  the last 6 month).    ALLERGIES: No Known Allergies  MEDICATION LIST PRIOR TO VISIT: Current Outpatient Medications on File Prior to Visit  Medication Sig Dispense Refill  . aspirin EC 81 MG tablet Take 81 mg by mouth daily.    . hydrochlorothiazide (HYDRODIURIL) 50 MG tablet Take 25 mg by mouth daily.     . isosorbide mononitrate (IMDUR) 30 MG 24 hr tablet Take 15 mg by mouth daily.     Marland Kitchen losartan (COZAAR) 100 MG tablet Take 100 mg by mouth at bedtime.     . nitroGLYCERIN (NITROSTAT) 0.4 MG SL tablet Place 0.4 mg under the tongue every 5 (five) minutes as needed for chest pain.    Marland Kitchen psyllium (METAMUCIL) 58.6 % powder Take 1 packet by mouth daily as needed (Constipation).     . tamsulosin (FLOMAX) 0.4 MG CAPS capsule Take 0.8 mg by mouth daily after supper.     . doxycycline (DORYX) 100 MG EC tablet Take 100 mg by mouth 2 (two) times daily.     No current facility-administered medications on file prior to visit.    PAST MEDICAL HISTORY: Past Medical History:  Diagnosis Date  . BPH (benign prostatic hyperplasia)   . GERD (gastroesophageal reflux disease)   . Hypertension     PAST SURGICAL HISTORY: Past Surgical History:  Procedure Laterality Date  . APPENDECTOMY    . BACK SURGERY  1993  . CARDIAC CATHETERIZATION    . LEFT HEART CATH AND CORONARY ANGIOGRAPHY N/A 06/27/2019   Procedure: LEFT HEART CATH AND CORONARY ANGIOGRAPHY;  Surgeon: Yates Decamp, MD;  Location: [facility] INVASIVE CV LAB;  Service: Cardiovascular;  Laterality: N/A;  . NECK SURGERY    .  TONSILLECTOMY    . TOTAL KNEE ARTHROPLASTY Bilateral 2006/2007    FAMILY HISTORY: The patient family history includes Hypertension in his mother.   SOCIAL HISTORY:  The patient  reports that he quit smoking about 54 years ago. His smoking use included cigarettes. He has a 20.00 pack-year smoking history. He has never used smokeless tobacco. He reports that he does not drink alcohol or use drugs.  REVIEW OF SYSTEMS: Review of  Systems  Constitution: Negative for chills and fever.  HENT: Negative for ear discharge, ear pain and nosebleeds.   Eyes: Negative for blurred vision and discharge.  Cardiovascular: Positive for chest pain and dyspnea on exertion. Negative for claudication, leg swelling, near-syncope, orthopnea, palpitations, paroxysmal nocturnal dyspnea and syncope.  Respiratory: Positive for shortness of breath. Negative for cough.   Endocrine: Negative for polydipsia, polyphagia and polyuria.  Hematologic/Lymphatic: Negative for bleeding problem.  Skin: Negative for flushing and nail changes.  Musculoskeletal: Negative for muscle cramps, muscle weakness and myalgias.  Gastrointestinal: Negative for abdominal pain, dysphagia, hematemesis, hematochezia, melena, nausea and vomiting.  Neurological: Negative for dizziness, focal weakness and light-headedness.   PHYSICAL EXAM: Vitals with BMI 07/04/2019 06/27/2019 06/27/2019  Height 5\' 10"  - -  Weight 295 lbs - -  BMI 42.33 - -  Systolic 137 126  Diastolic 66 46 48  Pulse 66 57 60   CONSTITUTIONAL: Well-developed and well-nourished. No acute distress.  SKIN: Skin is warm and dry. No rash noted. No cyanosis. No pallor. No jaundice HEAD: Normocephalic and atraumatic.  EYES: No scleral icterus MOUTH/THROAT: Moist oral membranes.  NECK: No JVD present. No thyromegaly noted. No carotid bruits  LYMPHATIC: No visible cervical adenopathy.  CHEST Normal respiratory effort. No intercostal retractions  LUNGS: Clear to auscultation bilaterally.  No stridor. No wheezes. No rales.  CARDIOVASCULAR: Regular positive S1-S2, no murmurs rubs or gallops appreciated ABDOMINAL: Obese, soft, nontender, nondistended, positive bowel sounds in all 4 quadrants, no apparent ascites.  EXTREMITIES: No peripheral edema  HEMATOLOGIC: No significant bruising NEUROLOGIC: Oriented to person, place, and time. Nonfocal. Normal muscle tone.  PSYCHIATRIC: Normal mood and affect. Normal  behavior. Cooperative  CARDIAC DATABASE: EKG: 06/19/2019: Normal sinus rhythm, 82 bpm, poor R wave progression, possible old anterior and inferior infarct, no active myocardial injury pattern.  Echocardiogram: 06/22/2019: LVEF 60-65%, moderate LVH, indeterminate diastolic filling pattern, elevated left atrial pressure, aortic valve sclerosis without stenosis, mild MR, mild TR.  Stress Testing: None  Heart Catheterization: 06/27/19:  Normal LV systolic function, normal EDP. Right coronary artery with a mid 20% stenosis.  Significant improvement in flow with nitroglycerin administration suggestive of mild coronary spasm and microvascular angina. Left main normal. Circumflex moderate size, normal. Ramus intermediate very small, normal. LAD large, smooth and normal, gives origin to a small to moderate-sized D1 which is a ostial 60% stenosis which is 1.5 mm in diameter. Recommendation: Chest pain symptoms could be related to microvascular angina and also possibly coronary spasm.  Patient will be discharged home on amlodipine in addition to the current medical regimen.  45 mL contrast utilized.  LABORATORY DATA: CBC Latest Ref Rng & Units 06/27/2019 12/06/2008  WBC 4.0 - 10.5 K/uL 5.2 5.0  Hemoglobin 13.0 - 17.0 g/dL 02/05/2009 99.3  Hematocrit 71.6 - 52.0 % 42.0 42.8  Platelets 150 - 400 K/uL 216 184    CMP Latest Ref Rng & Units 06/19/2019 12/06/2008  Glucose 65 - 99 mg/dL 02/05/2009) 98  BUN 8 - 27 mg/dL 21 19  Creatinine 893(Y -  1.27 mg/dL 5.63 8.93  Sodium 734 - 144 mmol/L 140 140  Potassium 3.5 - 5.2 mmol/L 4.1 3.8  Chloride 96 - 106 mmol/L 101 103  CO2 20 - 29 mmol/L 25 31  Calcium 8.6 - 10.2 mg/dL 9.6 9.5  Total Protein 6.0 - 8.5 g/dL 6.7 -  Total Bilirubin 0.0 - 1.2 mg/dL 0.3 -  Alkaline Phos 39 - 117 IU/L 64 -  AST 0 - 40 IU/L 17 -  ALT 0 - 44 IU/L 16 -    Lipid Panel  No results found for: CHOL, TRIG, HDL, CHOLHDL, VLDL, LDLCALC, LDLDIRECT, LABVLDL  No results found for: HGBA1C  No components found for: NTPROBNP No results found for: TSH  FINAL MEDICATION LIST END OF ENCOUNTER: Meds ordered this encounter  Medications  . amLODipine (NORVASC) 10 MG tablet    Sig: Take 1 tablet (10 mg total) by mouth daily.    Dispense:  90 tablet    Refill:  0  . metoprolol succinate (TOPROL XL) 50 MG 24 hr tablet    Sig: Take 1 tablet (50 mg total) by mouth every morning. Hold if systolic blood pressure (top blood pressure number) less than 100 mmHg or heart rate less than 60 bpm (pulse).    Dispense:  90 tablet    Refill:  1    Medications Discontinued During This Encounter  Medication Reason  . pantoprazole (PROTONIX) 40 MG tablet Completed Course  . metoprolol succinate (TOPROL XL) 25 MG 24 hr tablet   . amLODipine (NORVASC) 5 MG tablet      Current Outpatient Medications:  .  amLODipine (NORVASC) 10 MG tablet, Take 1 tablet (10 mg total) by mouth daily., Disp: 90 tablet, Rfl: 0 .  aspirin EC 81 MG tablet, Take 81 mg by mouth daily., Disp: , Rfl:  .  hydrochlorothiazide (HYDRODIURIL) 50 MG tablet, Take 25 mg by mouth daily. , Disp: , Rfl:  .  isosorbide mononitrate (IMDUR) 30 MG 24 hr tablet, Take 15 mg by mouth daily. , Disp: , Rfl:  .  losartan (COZAAR) 100 MG tablet, Take 100 mg by mouth at bedtime. , Disp: , Rfl:  .  metoprolol succinate (TOPROL XL) 50 MG 24 hr tablet, Take 1 tablet (50 mg total) by mouth every morning. Hold if systolic blood pressure (top blood pressure number) less than 100 mmHg or heart rate less than 60 bpm (pulse)., Disp: 90 tablet, Rfl: 1 .  nitroGLYCERIN (NITROSTAT) 0.4 MG SL tablet, Place 0.4 mg under the tongue every 5 (five) minutes as needed for chest pain., Disp: , Rfl:  .  psyllium (METAMUCIL) 58.6 % powder, Take 1 packet by mouth daily as needed (Constipation). , Disp: , Rfl:  .  tamsulosin (FLOMAX) 0.4 MG CAPS capsule, Take 0.8 mg by mouth daily after supper. , Disp: , Rfl:  .  doxycycline (DORYX) 100 MG EC tablet, Take 100 mg by mouth  2 (two) times daily., Disp: , Rfl:   IMPRESSION:    ICD-10-CM   1. Encounter to discuss test results  Z71.2   2. Microvascular angina (HCC)  I20.8 amLODipine (NORVASC) 10 MG tablet    metoprolol succinate (TOPROL XL) 50 MG 24 hr tablet    Lipid Panel With LDL/HDL Ratio  3. Essential hypertension  I10   4. Class 3 severe obesity due to excess calories without serious comorbidity with body mass index (BMI) of 40.0 to 44.9 in adult (HCC)  E66.01    Z68.41   5. Former  smoker  Z87.891      RECOMMENDATIONS: Justin Coleman is a 86 y.o. male whose past medical history and cardiac risk factors include: Hypertension, former smoker, advanced age, obesity.  Microvascular angina:  Based on the most recent left heart catheterization patient's underlying angina pectoris appears to be secondary to microvascular angina and/or coronary spasm.  He was started on Norvasc 5 mg p.o. daily post left heart catheterization has tolerated the medication well.  However, in the setting of microvascular angina and elevated blood pressures we will increase Norvasc to 10 mg p.o. every morning.  Medications reconciled antianginal therapies include Imdur, Toprol-XL, and amlodipine.    Educated on importance of continued smoking cessation.  Echocardiogram results reviewed with the patient and his wife at today's visit.    Check fasting lipid profile.   Ordered and released.  Recommend follow-up with primary to screen for diabetes mellitus.    Patient has been started on Imdur by his PCP.  He was unable to tolerate a higher dose of Imdur.  We will continue 15 mg p.o. daily.  He also has sublingual nitroglycerin tablets to use on a as needed basis.  Medication profile discussed.  Educated on increasing physical activity 30 minutes a day 5 days a week or as tolerated.  Dyspnea on exertion: Improving  Based on the most recent left heart catheterization his LVEDP was normal.  Continue guideline directed  medical therapy for nonobstructive coronary disease.  Educated on increasing physical activity as tolerated with a goal of 30 minutes a day 5 days a week.    Nonobstructive coronary artery disease: See above  Benign essential hypertension:   Patient blood pressure at today's office visit is acceptable.  Patient did keep a log of his blood pressures for review.  His systolic blood pressure in the morning are much higher than in the evening hours.  His morning blood pressures been high as 185 mmHg.  Clinically I suspect that he has a degree of sleep apnea.  I have asked him to discuss it further with his primary care provider and possibly be referred to sleep medicine for sleep study.  Blood pressure currently managed by primary team.  Low-salt diet recommended.  Obesity, due to excess calories: Body mass index is 42.33 kg/m. . I reviewed with the patient the importance of diet, regular physical activity/exercise, weight loss.   . Patient is educated on increasing physical activity gradually as tolerated.  With the goal of moderate intensity exercise for 30 minutes a day 5 days a week.  Former smoker: Educated on the importance of continued smoking cessation.  Orders Placed This Encounter  Procedures  . Lipid Panel With LDL/HDL Ratio   --Continue cardiac medications as reconciled in final medication list. --Return in about 3 months (around 10/04/2019) for review test results., re-evaluation of symptoms.. Or sooner if needed. --Continue follow-up with your primary care physician regarding the management of your other chronic comorbid conditions.  Patient's questions and concerns were addressed to his satisfaction. He voices understanding of the instructions provided during this encounter.   This note was created using a voice recognition software as a result there may be grammatical errors inadvertently enclosed that do not reflect the nature of this encounter. Every attempt is made to  correct such errors.  Rex Kras, [place], [facility]  Pager: [phone] Office: [phone]

## 2019-07-06 LAB — LIPID PANEL WITH LDL/HDL RATIO
Cholesterol, Total: 150 mg/dL (ref 100–199)
HDL: 39 mg/dL — ABNORMAL LOW (ref >39)
LDL Chol Calc (NIH): 86 mg/dL (ref 0–99)
LDL/HDL Ratio: 2.2 ratio (ref 0.0–3.6)
Triglycerides: 141 mg/dL (ref 0–149)
VLDL Cholesterol Cal: 25 mg/dL (ref 5–40)

## 2019-07-11 NOTE — Progress Notes (Signed)
Called pt to inform him about his lab results. Pt understood

## 2019-08-07 ENCOUNTER — Other Ambulatory Visit: Payer: Self-pay

## 2019-08-07 ENCOUNTER — Telehealth: Payer: Self-pay

## 2019-08-07 DIAGNOSIS — I208 Other forms of angina pectoris: Secondary | ICD-10-CM

## 2019-08-07 MED ORDER — METOPROLOL SUCCINATE ER 50 MG PO TB24: 50.0000 mg | ORAL_TABLET | Freq: Every morning | ORAL | 1 refills | Status: DC

## 2019-08-21 ENCOUNTER — Other Ambulatory Visit: Payer: Self-pay | Admitting: Orthopedic Surgery

## 2019-08-21 DIAGNOSIS — M5416 Radiculopathy, lumbar region: Secondary | ICD-10-CM

## 2019-09-26 ENCOUNTER — Other Ambulatory Visit: Payer: Medicare PPO

## 2019-10-04 ENCOUNTER — Ambulatory Visit: Payer: Medicare PPO | Admitting: Cardiology

## 2019-10-10 ENCOUNTER — Other Ambulatory Visit: Payer: Self-pay | Admitting: Cardiology

## 2019-10-10 DIAGNOSIS — I208 Other forms of angina pectoris: Secondary | ICD-10-CM

## 2019-10-20 ENCOUNTER — Other Ambulatory Visit: Payer: Self-pay | Admitting: Neurosurgery

## 2019-10-23 ENCOUNTER — Telehealth: Payer: Self-pay | Admitting: Cardiology

## 2019-10-23 NOTE — Telephone Encounter (Signed)
PRE-OPERATIVE CARDIAC RISK ASSESSMENT  10/23/19  Patient's name: Justin Coleman.   MRN: [id number].    DOB: 01-24-1933  Primary care provider: Kaleen Mask, MD.  ISAO SELTZER is a 86 y.o. male for which I have been requested to assess estimated cardiac risk for planned non-cardiac surgery.  Patient is scheduled for L3-L4 extraforamian microdiscectomy with Dr. Wynetta Emery on 10/25/2019.  Patient presents low to moderate cardiac risk for the planned noncardiac surgery.   Patient states no significant change from a cardiovascular standpoint since last office visit. Using the preoperative risk assessment guidelines as published by the [employer] of Cardiology, the patient presents acceptable cardiac risk for the planned noncardiac surgery because there is no history of recent unstable angina pectoris or myocardial infarction, no signs or symptoms of decompensated CHF, no unaddressed complex dysrhythmia, no significant aortic valvular stenosis, and recently underwent an ischemic evaluation including an echocardiogram and left heart catheterization. This is due to his nonmodifiable cardiovascular risk factors.  Patient finds this preoperative risk assessment acceptable.  Patient is currently on aspirin 81 mg p.o. daily which can be held either 7 days before surgery or as per the surgeon's discretion.  Please resume aspirin after surgery once hemostasis is achieved as per surgeon's discretion.  This preoperative risk assessment is a tool to assist the surgeon in estimating the cardiac risk for the proposed upcoming noncardiac surgery.  The shared decision to proceed with surgery will be ultimately at the discretion of the patient after the surgical risks, benefits, and alternatives have been discussed amongst the patient and his surgical team.  Please attach the last office note.    Telephone encounter: 9 minutes.   Sincerely,   Tessa Lerner, [place], [facility]  Pager: [phone] Office:  [phone]

## 2019-10-24 ENCOUNTER — Encounter (HOSPITAL_COMMUNITY): Payer: Self-pay

## 2019-10-24 ENCOUNTER — Other Ambulatory Visit: Payer: Self-pay

## 2019-10-24 ENCOUNTER — Other Ambulatory Visit (HOSPITAL_COMMUNITY)
Admission: RE | Admit: 2019-10-24 | Discharge: 2019-10-24 | Disposition: A | Discharge status: Home / Self Care | Payer: Medicare PPO | Source: Ambulatory Visit | Attending: Neurosurgery | Admitting: Neurosurgery

## 2019-10-24 ENCOUNTER — Encounter (HOSPITAL_COMMUNITY)
Admission: RE | Admit: 2019-10-24 | Discharge: 2019-10-24 | Disposition: A | Discharge status: Home / Self Care | Payer: Medicare PPO | Source: Ambulatory Visit | Attending: Neurosurgery | Admitting: Neurosurgery

## 2019-10-24 DIAGNOSIS — Z20822 Contact with and (suspected) exposure to covid-19: Secondary | ICD-10-CM | POA: Insufficient documentation

## 2019-10-24 DIAGNOSIS — R079 Chest pain, unspecified: Secondary | ICD-10-CM | POA: Insufficient documentation

## 2019-10-24 DIAGNOSIS — Z01812 Encounter for preprocedural laboratory examination: Secondary | ICD-10-CM | POA: Insufficient documentation

## 2019-10-24 DIAGNOSIS — I2510 Atherosclerotic heart disease of native coronary artery without angina pectoris: Secondary | ICD-10-CM | POA: Diagnosis not present

## 2019-10-24 HISTORY — DX: Atherosclerotic heart disease of native coronary artery without angina pectoris: I25.10

## 2019-10-24 HISTORY — DX: Sleep apnea, unspecified: G47.30

## 2019-10-24 HISTORY — DX: Unspecified osteoarthritis, unspecified site: M19.90

## 2019-10-24 HISTORY — DX: Dyspnea, unspecified: R06.00

## 2019-10-24 LAB — SARS CORONAVIRUS 2 (TAT 6-24 HRS): SARS Coronavirus 2: NEGATIVE

## 2019-10-24 LAB — SURGICAL PCR SCREEN
MRSA, PCR: NEGATIVE
Staphylococcus aureus: NEGATIVE

## 2019-10-24 LAB — BASIC METABOLIC PANEL
Anion gap: 9 (ref 5–15)
BUN: 19 mg/dL (ref 8–23)
CO2: 26 mmol/L (ref 22–32)
Calcium: 9.3 mg/dL (ref 8.9–10.3)
Chloride: 104 mmol/L (ref 98–111)
Creatinine, Ser: 0.86 mg/dL (ref 0.61–1.24)
GFR calc Af Amer: >60 mL/min (ref >60)
GFR calc non Af Amer: >60 mL/min (ref >60)
Glucose, Bld: 119 mg/dL — ABNORMAL HIGH (ref 70–99)
Potassium: 3.5 mmol/L (ref 3.5–5.1)
Sodium: 139 mmol/L (ref 135–145)

## 2019-10-24 LAB — CBC
HCT: 42.9 % (ref 39.0–52.0)
Hemoglobin: 14 g/dL (ref 13.0–17.0)
MCH: 31.3 pg (ref 26.0–34.0)
MCHC: 32.6 g/dL (ref 30.0–36.0)
MCV: 95.8 fL (ref 80.0–100.0)
Platelets: 216 10*3/uL (ref 150–400)
RBC: 4.48 MIL/uL (ref 4.22–5.81)
RDW: 14.7 % (ref 11.5–15.5)
WBC: 8 10*3/uL (ref 4.0–10.5)
nRBC: 0 % (ref 0.0–0.2)

## 2019-10-24 LAB — HEMOGLOBIN A1C
Hgb A1c MFr Bld: 6.1 % — ABNORMAL HIGH (ref 4.8–5.6)
Mean Plasma Glucose: 128 mg/dL

## 2019-10-24 MED ORDER — DEXTROSE 5 % IV SOLN
3.0000 g | INTRAVENOUS | Status: AC
  Administered 2019-10-25: 3 g via INTRAVENOUS
  Filled 2019-10-24: qty 3

## 2019-10-24 NOTE — Progress Notes (Signed)
Anesthesia Chart Review:  Patient was recently evaluated by cardiology for complaint of chest pain.  Ultimately underwent left heart cath showing nonobstructive CAD, significant improvement in flow with nitroglycerin administration suggestive of mild coronary spasm and microvascular angina.  Medical management was recommended.  Cardiac clearance per telephone encounter by Dr. Odis Hollingshead 10/23/2019, "Patient presents low to moderate cardiac risk for the planned noncardiac surgery.  Patient states no significant change from a cardiovascular standpoint since last office visit. Using the preoperative risk assessment guidelines as published by the [employer] of Cardiology, the patient presents acceptable cardiac risk for the planned noncardiac surgery because there is no history of recent unstable angina pectoris or myocardial infarction, no signs or symptoms of decompensated CHF, no unaddressed complex dysrhythmia, no significant aortic valvular stenosis, and recently underwent an ischemic evaluation including an echocardiogram and left heart catheterization. This is due to his nonmodifiable cardiovascular risk factors.  Patient finds this preoperative risk assessment acceptable."  Preop labs reviewed, unremarkable.  06/19/2019: Normal sinus rhythm, 82 bpm, poor R wave progression, possible old anterior and inferior infarct, no active myocardial injury pattern.  Left Heart Catheterization 06/27/19:  Normal LV systolic function, normal EDP. Right coronary artery with a mid 20% stenosis.  Significant improvement in flow with nitroglycerin administration suggestive of mild coronary spasm and microvascular angina. Left main normal. Circumflex moderate size, normal. Ramus intermediate very small, normal. LAD large, smooth and normal, gives origin to a small to moderate-sized D1 which is a ostial 60% stenosis which is 1.5 mm in diameter.  Recommendation: Chest pain symptoms could be related to microvascular  angina and also possibly coronary spasm.  Patient will be discharged home on amlodipine in addition to the current medical regimen.  45 mL contrast utilized.  Echocardiogram 06/22/2019:  Normal LV systolic function with visual EF 60-65%. Left ventricle cavity  is normal in size. Moderate left ventricular hypertrophy. Normal global  wall motion. Indeterminate diastolic filling pattern, elevated LAP.  Calculated EF 66%.  Aortic sclerosis without stenosis.  Mild (Grade I) mitral regurgitation.  Mild tricuspid regurgitation.  No prior study for comparison.   [facility]/Anesthesiology Phone [phone] 10/24/2019 1:12 PM

## 2019-10-24 NOTE — Pre-Procedure Instructions (Addendum)
Justin Coleman  10/24/2019    Your procedure is scheduled on Wednesday, August 25.  Report to [facility], Main Entrance or Entrance "A" at  5:30 AM                             Your surgery or procedure is scheduled to begin at  7:30 A.M.   Call this number if you have problems the morning of surgery: [phone]  This is the number for the Pre- Surgical Desk.                  For any other questions, please call [phone], Monday - Friday 8 AM - 4 PM.   Remember:  Do not eat or drink after midnight.   Take these medicines the morning of surgery with A SIP OF WATER:  None  If needed take Nitroglycerin  Special instructions:   [name]- Preparing For Surgery  Before surgery, you can play an important role. Because skin is not sterile, your skin needs to be as free of germs as possible. You can reduce the number of germs on your skin by washing with CHG (chlorahexidine gluconate) Soap before surgery.  CHG is an antiseptic cleaner which kills germs and bonds with the skin to continue killing germs even after washing.    Oral Hygiene is also important to reduce your risk of infection.  Remember - BRUSH YOUR TEETH THE MORNING OF SURGERY WITH YOUR REGULAR TOOTHPASTE  Please do not use if you have an allergy to CHG or antibacterial soaps. If your skin becomes reddened/irritated stop using the CHG.  Do not shave (including legs and underarms) for at least 48 hours prior to first CHG shower. It is OK to shave your face.  Please follow these instructions carefully.   1. Shower the NIGHT BEFORE SURGERY and the MORNING OF SURGERY with CHG.   2. If you chose to wash your hair, wash your hair first as usual with your normal shampoo.  3. After you shampoo, wash your face and private area with the soap you use at home, then rinse your hair and body thoroughly to remove the shampoo and soap.  4. Use CHG as you would any other liquid soap. You can apply CHG directly to the  skin and wash gently with a scrungie or a clean washcloth.   5. Apply the CHG Soap to your body ONLY FROM THE NECK DOWN.  Do not use on open wounds or open sores. Avoid contact with your eyes, ears, mouth.  6. Wash thoroughly, paying special attention to the area where your surgery will be performed.  7. Thoroughly rinse your body with warm water from the neck down.  8. DO NOT shower/wash with your normal soap after using and rinsing off the CHG Soap.  9. Pat yourself dry with a CLEAN TOWEL.  10. Wear CLEAN PAJAMAS to bed the night before surgery, wear comfortable clothes the morning of surgery  11. Place CLEAN SHEETS on your bed the night of your first shower and DO NOT SLEEP WITH PETS.  Day of Surgery: Shower as instructed above. Do not wear lotions, powders, or cologne, or deodorant. Please wear clean clothes to the hospital/surgery center.   Remember to brush your teeth WITH YOUR REGULAR TOOTHPASTE             Men may shave face and neck.  Do not bring valuables  to the hospital.  [facility] is not responsible for any belongings or valuables.  Contacts, dentures or bridgework may not be worn into surgery.  Leave your suitcase in the car.  After surgery it may be brought to your room.  For patients admitted to the hospital, discharge time will be determined by your treatment team.  Patients discharged the day of surgery will not be allowed to drive home.   Please read over the following fact sheets that you were given. Coughing and Deep Breathing, Pain Booklet, and Surgical Site Infections

## 2019-10-24 NOTE — Anesthesia Preprocedure Evaluation (Addendum)
Anesthesia Evaluation  Patient identified by MRN, date of birth, ID band Patient awake    Reviewed: Allergy & Precautions, NPO status , Patient's Chart, lab work & pertinent test results, reviewed documented beta blocker date and time   History of Anesthesia Complications Negative for: history of anesthetic complications  Airway Mallampati: II  TM Distance: >3 FB Neck ROM: Full    Dental  (+) Dental Advisory Given   Pulmonary sleep apnea (BiPAP) , former smoker,  10/24/2019 SARS coronavirus NEG   breath sounds clear to auscultation       Cardiovascular hypertension, Pt. on medications and Pt. on home beta blockers (-) angina(-) CAD  Rhythm:Regular Rate:Normal  06/2019 Cath: non-obstructive 04/2019 ECHO: Normal LV systolic function with visual EF 60-65%. Left ventricle cavity is normal in size. Moderate left ventricular hypertrophy. Normal global wall motion. Indeterminate diastolic filling pattern, elevated LAP.  Calculated EF 66%.  Aortic sclerosis without stenosis.  Mild (Grade I) mitral regurgitation.  Mild tricuspid regurgitation   Neuro/Psych Chronic back pain    GI/Hepatic Neg liver ROS, GERD  Controlled,  Endo/Other  Morbid obesity  Renal/GU negative Renal ROS     Musculoskeletal  (+) Arthritis ,   Abdominal (+) + obese,   Peds  Hematology negative hematology ROS (+)   Anesthesia Other Findings   Reproductive/Obstetrics                           Anesthesia Physical Anesthesia Plan  ASA: III  Anesthesia Plan: General   Post-op Pain Management:    Induction: Intravenous  PONV Risk Score and Plan: 2 and Ondansetron, Dexamethasone and Treatment may vary due to age or medical condition  Airway Management Planned: Oral ETT  Additional Equipment: None  Intra-op Plan:   Post-operative Plan: Extubation in OR  Informed Consent: I have reviewed the patients History and Physical,  chart, labs and discussed the procedure including the risks, benefits and alternatives for the proposed anesthesia with the patient or authorized representative who has indicated his/her understanding and acceptance.     Dental advisory given  Plan Discussed with: CRNA and Surgeon  Anesthesia Plan Comments: (PAT note by Antionette Poles, PA-C: Patient was recently evaluated by cardiology for complaint of chest pain.  Ultimately underwent left heart cath showing nonobstructive CAD, significant improvement in flow with nitroglycerin administration suggestive of mild coronary spasm and microvascular angina.  Medical management was recommended.  Cardiac clearance per telephone encounter by Dr. Odis Hollingshead 10/23/2019, "Patient presentslow to moderatecardiac risk for the planned noncardiac surgery.Patientstatesno significant change from a cardiovascular standpoint since last office visit. Using the preoperative risk assessment guidelines as published by the [employer] of [employer]iology, the patient presentsacceptablecardiac risk for the planned noncardiac surgery because there is no history of recent unstable angina pectoris or myocardial infarction, no signs or symptoms of decompensated CHF, no unaddressed complex dysrhythmia, no significant aortic valvular stenosis, andrecently underwent an ischemic evaluation including an echocardiogram and left heart catheterization. This is due to his nonmodifiable cardiovascular risk factors. Patient finds this preoperative risk assessment acceptable."  Preop labs reviewed, unremarkable.  06/19/2019: Normal sinus rhythm, 82 bpm, poor R wave progression, possible old anterior and inferior infarct, no active myocardial injury pattern.  Left Heart Catheterization 06/27/19: Normal LV systolic function, normal EDP. Right coronary artery with a mid 20% stenosis. Significant improvement in flow with nitroglycerin administration suggestive of mild coronary spasm and  microvascular angina. Left main normal. Circumflex moderate size, normal. Ramus  intermediate very small, normal. LAD large, smooth and normal, gives origin to a small to moderate-sized D1 which is a ostial 60% stenosis which is 1.5 mm in diameter.  Recommendation: Chest pain symptoms could be related to microvascular angina and also possibly coronary spasm. Patient will be discharged home on amlodipine in addition to the current medical regimen. 45 mL contrast utilized.  Echocardiogram 06/22/2019:  Normal LV systolic function with visual EF 60-65%. Left ventricle cavity  is normal in size. Moderate left ventricular hypertrophy. Normal global  wall motion. Indeterminate diastolic filling pattern, elevated LAP.  Calculated EF 66%.  Aortic sclerosis without stenosis.  Mild (Grade I) mitral regurgitation.  Mild tricuspid regurgitation.  No prior study for comparison. )      Anesthesia Quick Evaluation

## 2019-10-24 NOTE — Progress Notes (Addendum)
PCP - Dr Jeannetta Nap  Cardiologist - Dr. Tessa Lerner  Chest x-ray - na  EKG - 06/19/19  Stress Test - no  ECHO - 06/24/19  Cardiac Cath - 06/27/19  Sleep Study - 07/2019 CPAP - yes- instructed to bring mask.  LABS- CBC, BMP, A1C, PCR  ASA-Ok to hold up 7 days prior to surgery  ERAS-no  HA1C-10/24/19  Tested - recommended by cardiologist for Dr. Jeannetta Nap to evaluate for DM- I do not have his office records that are requested.  Fasting Blood Sugar - na Checks Blood Sugar ___na__ times a day  Anesthesia-  Pt denies having chest pain, sob, or fever at this time. All instructions explained to the pt, with a verbal understanding of the material. Pt agrees to go over the instructions while at home for a better understanding. Pt also instructed to self quarantine after being tested for COVID-19. The opportunity to ask questions was provided.

## 2019-10-25 ENCOUNTER — Ambulatory Visit (HOSPITAL_COMMUNITY)
Admission: RE | Admit: 2019-10-25 | Discharge: 2019-10-25 | Disposition: A | Discharge status: Home / Self Care | Payer: Medicare PPO | Attending: Neurosurgery | Admitting: Neurosurgery

## 2019-10-25 ENCOUNTER — Encounter (HOSPITAL_COMMUNITY): Payer: Self-pay | Admitting: Neurosurgery

## 2019-10-25 ENCOUNTER — Ambulatory Visit (HOSPITAL_COMMUNITY): Payer: Medicare PPO | Admitting: Anesthesiology

## 2019-10-25 ENCOUNTER — Ambulatory Visit (HOSPITAL_COMMUNITY): Payer: Medicare PPO | Admitting: Physician Assistant

## 2019-10-25 ENCOUNTER — Ambulatory Visit (HOSPITAL_COMMUNITY): Payer: Medicare PPO

## 2019-10-25 ENCOUNTER — Encounter (HOSPITAL_COMMUNITY)
Admission: RE | Disposition: A | Discharge status: Home / Self Care | Payer: Self-pay | Source: Home / Self Care | Attending: Neurosurgery

## 2019-10-25 DIAGNOSIS — G8929 Other chronic pain: Secondary | ICD-10-CM | POA: Diagnosis not present

## 2019-10-25 DIAGNOSIS — G4730 Sleep apnea, unspecified: Secondary | ICD-10-CM | POA: Diagnosis not present

## 2019-10-25 DIAGNOSIS — M48061 Spinal stenosis, lumbar region without neurogenic claudication: Secondary | ICD-10-CM | POA: Diagnosis not present

## 2019-10-25 DIAGNOSIS — Z885 Allergy status to narcotic agent status: Secondary | ICD-10-CM | POA: Insufficient documentation

## 2019-10-25 DIAGNOSIS — M5126 Other intervertebral disc displacement, lumbar region: Secondary | ICD-10-CM | POA: Diagnosis present

## 2019-10-25 DIAGNOSIS — I081 Rheumatic disorders of both mitral and tricuspid valves: Secondary | ICD-10-CM | POA: Insufficient documentation

## 2019-10-25 DIAGNOSIS — K219 Gastro-esophageal reflux disease without esophagitis: Secondary | ICD-10-CM | POA: Diagnosis not present

## 2019-10-25 DIAGNOSIS — M5116 Intervertebral disc disorders with radiculopathy, lumbar region: Secondary | ICD-10-CM | POA: Insufficient documentation

## 2019-10-25 DIAGNOSIS — I509 Heart failure, unspecified: Secondary | ICD-10-CM | POA: Diagnosis not present

## 2019-10-25 DIAGNOSIS — M549 Dorsalgia, unspecified: Secondary | ICD-10-CM | POA: Diagnosis not present

## 2019-10-25 DIAGNOSIS — I110 Hypertensive heart disease with heart failure: Secondary | ICD-10-CM | POA: Diagnosis not present

## 2019-10-25 DIAGNOSIS — Z8249 Family history of ischemic heart disease and other diseases of the circulatory system: Secondary | ICD-10-CM | POA: Insufficient documentation

## 2019-10-25 DIAGNOSIS — Z7982 Long term (current) use of aspirin: Secondary | ICD-10-CM | POA: Insufficient documentation

## 2019-10-25 DIAGNOSIS — Z96653 Presence of artificial knee joint, bilateral: Secondary | ICD-10-CM | POA: Diagnosis not present

## 2019-10-25 DIAGNOSIS — Z79899 Other long term (current) drug therapy: Secondary | ICD-10-CM | POA: Diagnosis not present

## 2019-10-25 DIAGNOSIS — Z981 Arthrodesis status: Secondary | ICD-10-CM | POA: Diagnosis not present

## 2019-10-25 DIAGNOSIS — I700 Atherosclerosis of aorta: Secondary | ICD-10-CM | POA: Diagnosis not present

## 2019-10-25 DIAGNOSIS — Z87891 Personal history of nicotine dependence: Secondary | ICD-10-CM | POA: Diagnosis not present

## 2019-10-25 DIAGNOSIS — N400 Enlarged prostate without lower urinary tract symptoms: Secondary | ICD-10-CM | POA: Diagnosis not present

## 2019-10-25 DIAGNOSIS — M1990 Unspecified osteoarthritis, unspecified site: Secondary | ICD-10-CM | POA: Insufficient documentation

## 2019-10-25 DIAGNOSIS — I2510 Atherosclerotic heart disease of native coronary artery without angina pectoris: Secondary | ICD-10-CM | POA: Diagnosis not present

## 2019-10-25 DIAGNOSIS — Z6841 Body Mass Index (BMI) 40.0 and over, adult: Secondary | ICD-10-CM | POA: Insufficient documentation

## 2019-10-25 DIAGNOSIS — Z419 Encounter for procedure for purposes other than remedying health state, unspecified: Secondary | ICD-10-CM

## 2019-10-25 HISTORY — PX: LUMBAR LAMINECTOMY/DECOMPRESSION MICRODISCECTOMY: SHX5026

## 2019-10-25 SURGERY — LUMBAR LAMINECTOMY/DECOMPRESSION MICRODISCECTOMY 1 LEVEL
Anesthesia: General | Site: Spine Lumbar | Laterality: Left

## 2019-10-25 MED ORDER — METOPROLOL SUCCINATE ER 50 MG PO TB24: 50.0000 mg | ORAL_TABLET | Freq: Every day | ORAL | Status: DC

## 2019-10-25 MED ORDER — HYDROCHLOROTHIAZIDE 25 MG PO TABS
50.0000 mg | ORAL_TABLET | Freq: Every day | ORAL | Status: DC
  Administered 2019-10-25: 50 mg via ORAL
  Filled 2019-10-25: qty 2

## 2019-10-25 MED ORDER — DEXAMETHASONE SODIUM PHOSPHATE 10 MG/ML IJ SOLN
10.0000 mg | Freq: Once | INTRAMUSCULAR | Status: AC
  Administered 2019-10-25: 10 mg via INTRAVENOUS

## 2019-10-25 MED ORDER — PSYLLIUM 95 % PO PACK
1.0000 | PACK | Freq: Every day | ORAL | Status: DC
  Filled 2019-10-25: qty 1

## 2019-10-25 MED ORDER — SODIUM CHLORIDE 0.9% FLUSH: 3.0000 mL | Freq: Two times a day (BID) | INTRAVENOUS | Status: DC

## 2019-10-25 MED ORDER — FENTANYL CITRATE (PF) 100 MCG/2ML IJ SOLN: 25.0000 ug | INTRAMUSCULAR | Status: DC | PRN

## 2019-10-25 MED ORDER — ORAL CARE MOUTH RINSE: 15.0000 mL | Freq: Once | OROMUCOSAL | Status: AC

## 2019-10-25 MED ORDER — THROMBIN 5000 UNITS EX SOLR
CUTANEOUS | Status: AC
  Filled 2019-10-25: qty 10000

## 2019-10-25 MED ORDER — HYDROMORPHONE HCL 1 MG/ML IJ SOLN: 0.5000 mg | INTRAMUSCULAR | Status: DC | PRN

## 2019-10-25 MED ORDER — PHENOL 1.4 % MT LIQD: 1.0000 | OROMUCOSAL | Status: DC | PRN

## 2019-10-25 MED ORDER — NITROGLYCERIN 0.4 MG SL SUBL: 0.4000 mg | SUBLINGUAL_TABLET | SUBLINGUAL | Status: DC | PRN

## 2019-10-25 MED ORDER — BUPIVACAINE HCL (PF) 0.25 % IJ SOLN
INTRAMUSCULAR | Status: AC
  Filled 2019-10-25: qty 30

## 2019-10-25 MED ORDER — LIDOCAINE-EPINEPHRINE 1 %-1:100000 IJ SOLN
INTRAMUSCULAR | Status: AC
  Filled 2019-10-25: qty 1

## 2019-10-25 MED ORDER — ASPIRIN EC 81 MG PO TBEC
81.0000 mg | DELAYED_RELEASE_TABLET | Freq: Every day | ORAL | Status: DC
  Administered 2019-10-25: 81 mg via ORAL
  Filled 2019-10-25: qty 1

## 2019-10-25 MED ORDER — EPHEDRINE SULFATE-NACL 50-0.9 MG/10ML-% IV SOSY
PREFILLED_SYRINGE | INTRAVENOUS | Status: DC | PRN
  Administered 2019-10-25: 10 mg via INTRAVENOUS
  Administered 2019-10-25: 5 mg via INTRAVENOUS
  Administered 2019-10-25: 10 mg via INTRAVENOUS

## 2019-10-25 MED ORDER — LACTATED RINGERS IV SOLN: INTRAVENOUS | Status: DC

## 2019-10-25 MED ORDER — HEMOSTATIC AGENTS (NO CHARGE) OPTIME
TOPICAL | Status: DC | PRN
  Administered 2019-10-25: 1 via TOPICAL

## 2019-10-25 MED ORDER — ACETAMINOPHEN 500 MG PO TABS
1000.0000 mg | ORAL_TABLET | Freq: Once | ORAL | Status: AC
  Administered 2019-10-25: 1000 mg via ORAL
  Filled 2019-10-25: qty 2

## 2019-10-25 MED ORDER — SODIUM CHLORIDE 0.9 % IV SOLN: INTRAVENOUS | Status: DC | PRN

## 2019-10-25 MED ORDER — CHLORHEXIDINE GLUCONATE CLOTH 2 % EX PADS: 6.0000 | MEDICATED_PAD | Freq: Once | CUTANEOUS | Status: DC

## 2019-10-25 MED ORDER — ISOSORBIDE MONONITRATE ER 30 MG PO TB24
15.0000 mg | ORAL_TABLET | Freq: Every evening | ORAL | Status: DC
  Filled 2019-10-25: qty 1

## 2019-10-25 MED ORDER — ONDANSETRON HCL 4 MG/2ML IJ SOLN
INTRAMUSCULAR | Status: DC | PRN
  Administered 2019-10-25: 4 mg via INTRAVENOUS

## 2019-10-25 MED ORDER — AMOXICILLIN 500 MG PO CAPS: 2000.0000 mg | ORAL_CAPSULE | ORAL | Status: DC

## 2019-10-25 MED ORDER — SUGAMMADEX SODIUM 200 MG/2ML IV SOLN
INTRAVENOUS | Status: DC | PRN
  Administered 2019-10-25: 200 mg via INTRAVENOUS

## 2019-10-25 MED ORDER — SODIUM CHLORIDE 0.9% FLUSH: 3.0000 mL | INTRAVENOUS | Status: DC | PRN

## 2019-10-25 MED ORDER — ONDANSETRON HCL 4 MG PO TABS: 4.0000 mg | ORAL_TABLET | Freq: Four times a day (QID) | ORAL | Status: DC | PRN

## 2019-10-25 MED ORDER — MENTHOL 3 MG MT LOZG: 1.0000 | LOZENGE | OROMUCOSAL | Status: DC | PRN

## 2019-10-25 MED ORDER — LIDOCAINE-EPINEPHRINE 1 %-1:100000 IJ SOLN
INTRAMUSCULAR | Status: DC | PRN
  Administered 2019-10-25: 10 mL

## 2019-10-25 MED ORDER — PANTOPRAZOLE SODIUM 40 MG IV SOLR: 40.0000 mg | Freq: Every day | INTRAVENOUS | Status: DC

## 2019-10-25 MED ORDER — METHOCARBAMOL 500 MG PO TABS: 500.0000 mg | ORAL_TABLET | Freq: Four times a day (QID) | ORAL | 0 refills | Status: DC

## 2019-10-25 MED ORDER — BUPIVACAINE HCL (PF) 0.25 % IJ SOLN
INTRAMUSCULAR | Status: DC | PRN
  Administered 2019-10-25: 10 mL

## 2019-10-25 MED ORDER — CEFAZOLIN SODIUM-DEXTROSE 2-4 GM/100ML-% IV SOLN
2.0000 g | Freq: Three times a day (TID) | INTRAVENOUS | Status: DC
  Administered 2019-10-25: 2 g via INTRAVENOUS
  Filled 2019-10-25: qty 100

## 2019-10-25 MED ORDER — FENTANYL CITRATE (PF) 250 MCG/5ML IJ SOLN
INTRAMUSCULAR | Status: AC
  Filled 2019-10-25: qty 5

## 2019-10-25 MED ORDER — ALUM & MAG HYDROXIDE-SIMETH 200-200-20 MG/5ML PO SUSP: 30.0000 mL | Freq: Four times a day (QID) | ORAL | Status: DC | PRN

## 2019-10-25 MED ORDER — PROPOFOL 10 MG/ML IV BOLUS
INTRAVENOUS | Status: AC
  Filled 2019-10-25: qty 20

## 2019-10-25 MED ORDER — PROPOFOL 10 MG/ML IV BOLUS
INTRAVENOUS | Status: DC | PRN
  Administered 2019-10-25: 130 mg via INTRAVENOUS

## 2019-10-25 MED ORDER — ROCURONIUM BROMIDE 10 MG/ML (PF) SYRINGE
PREFILLED_SYRINGE | INTRAVENOUS | Status: DC | PRN
  Administered 2019-10-25: 60 mg via INTRAVENOUS
  Administered 2019-10-25: 20 mg via INTRAVENOUS

## 2019-10-25 MED ORDER — LIDOCAINE 2% (20 MG/ML) 5 ML SYRINGE
INTRAMUSCULAR | Status: DC | PRN
  Administered 2019-10-25: 100 mg via INTRAVENOUS

## 2019-10-25 MED ORDER — ACETAMINOPHEN 650 MG RE SUPP: 650.0000 mg | RECTAL | Status: DC | PRN

## 2019-10-25 MED ORDER — ONDANSETRON HCL 4 MG/2ML IJ SOLN: 4.0000 mg | Freq: Four times a day (QID) | INTRAMUSCULAR | Status: DC | PRN

## 2019-10-25 MED ORDER — ACETAMINOPHEN 325 MG PO TABS: 650.0000 mg | ORAL_TABLET | ORAL | Status: DC | PRN

## 2019-10-25 MED ORDER — LOSARTAN POTASSIUM 50 MG PO TABS: 100.0000 mg | ORAL_TABLET | Freq: Every evening | ORAL | Status: DC

## 2019-10-25 MED ORDER — 0.9 % SODIUM CHLORIDE (POUR BTL) OPTIME
TOPICAL | Status: DC | PRN
  Administered 2019-10-25: 1000 mL

## 2019-10-25 MED ORDER — OXYCODONE HCL 5 MG PO TABS: 10.0000 mg | ORAL_TABLET | ORAL | Status: DC | PRN

## 2019-10-25 MED ORDER — SODIUM CHLORIDE 0.9 % IV SOLN: 250.0000 mL | INTRAVENOUS | Status: DC

## 2019-10-25 MED ORDER — CHLORHEXIDINE GLUCONATE 0.12 % MT SOLN
15.0000 mL | Freq: Once | OROMUCOSAL | Status: AC
  Administered 2019-10-25: 15 mL via OROMUCOSAL
  Filled 2019-10-25: qty 15

## 2019-10-25 MED ORDER — TAMSULOSIN HCL 0.4 MG PO CAPS: 0.8000 mg | ORAL_CAPSULE | Freq: Every day | ORAL | Status: DC

## 2019-10-25 MED ORDER — THROMBIN 5000 UNITS EX SOLR
CUTANEOUS | Status: DC | PRN
  Administered 2019-10-25 (×2): 5000 [IU] via TOPICAL

## 2019-10-25 MED ORDER — FENTANYL CITRATE (PF) 250 MCG/5ML IJ SOLN
INTRAMUSCULAR | Status: DC | PRN
Start: 2019-10-25 — End: 2019-10-25
  Administered 2019-10-25 (×2): 50 ug via INTRAVENOUS
  Administered 2019-10-25: 150 ug via INTRAVENOUS

## 2019-10-25 MED ORDER — CYCLOBENZAPRINE HCL 10 MG PO TABS
ORAL_TABLET | ORAL | Status: AC
  Filled 2019-10-25: qty 1

## 2019-10-25 MED ORDER — CYCLOBENZAPRINE HCL 10 MG PO TABS
10.0000 mg | ORAL_TABLET | Freq: Three times a day (TID) | ORAL | Status: DC | PRN
  Administered 2019-10-25: 10 mg via ORAL

## 2019-10-25 MED ORDER — AMLODIPINE BESYLATE 5 MG PO TABS: 5.0000 mg | ORAL_TABLET | Freq: Every evening | ORAL | Status: DC

## 2019-10-25 MED ORDER — PHENYLEPHRINE HCL-NACL 10-0.9 MG/250ML-% IV SOLN
INTRAVENOUS | Status: DC | PRN
  Administered 2019-10-25: 60 ug/min via INTRAVENOUS

## 2019-10-25 SURGICAL SUPPLY — 58 items
ADH SKN CLS APL DERMABOND .7 (GAUZE/BANDAGES/DRESSINGS) ×1
APL SKNCLS STERI-STRIP NONHPOA (GAUZE/BANDAGES/DRESSINGS) ×1
BAG DECANTER FOR FLEXI CONT (MISCELLANEOUS) ×3 IMPLANT
BAND INSRT 18 STRL LF DISP RB (MISCELLANEOUS) ×2
BAND RUBBER #18 3X1/16 STRL (MISCELLANEOUS) ×6 IMPLANT
BENZOIN TINCTURE PRP APPL 2/3 (GAUZE/BANDAGES/DRESSINGS) ×3 IMPLANT
BLADE SURG 11 STRL SS (BLADE) ×3 IMPLANT
BUR CUTTER 7.0 ROUND (BURR) ×3 IMPLANT
BUR MATCHSTICK NEURO 3.0 LAGG (BURR) ×3 IMPLANT
CANISTER SUCT 3000ML PPV (MISCELLANEOUS) ×3 IMPLANT
CARTRIDGE OIL MAESTRO DRILL (MISCELLANEOUS) ×1 IMPLANT
CLOSURE WOUND 1/2 X4 (GAUZE/BANDAGES/DRESSINGS) ×1
DERMABOND ADVANCED (GAUZE/BANDAGES/DRESSINGS) ×2
DERMABOND ADVANCED .7 DNX12 (GAUZE/BANDAGES/DRESSINGS) ×1 IMPLANT
DIFFUSER DRILL AIR PNEUMATIC (MISCELLANEOUS) ×3 IMPLANT
DRAPE HALF SHEET 40X57 (DRAPES) ×2 IMPLANT
DRAPE LAPAROTOMY 100X72X124 (DRAPES) ×3 IMPLANT
DRAPE MICROSCOPE LEICA (MISCELLANEOUS) ×3 IMPLANT
DRAPE SURG 17X23 STRL (DRAPES) ×3 IMPLANT
DRSG OPSITE POSTOP 4X6 (GAUZE/BANDAGES/DRESSINGS) ×2 IMPLANT
DURAPREP 26ML APPLICATOR (WOUND CARE) ×3 IMPLANT
ELECT BLADE 4.0 EZ CLEAN MEGAD (MISCELLANEOUS) ×3
ELECT REM PT RETURN 9FT ADLT (ELECTROSURGICAL) ×3
ELECTRODE BLDE 4.0 EZ CLN MEGD (MISCELLANEOUS) IMPLANT
ELECTRODE REM PT RTRN 9FT ADLT (ELECTROSURGICAL) ×1 IMPLANT
GAUZE SPONGE 4X4 12PLY STRL (GAUZE/BANDAGES/DRESSINGS) ×3 IMPLANT
GLOVE BIO SURGEON STRL SZ7 (GLOVE) ×2 IMPLANT
GLOVE BIO SURGEON STRL SZ8 (GLOVE) ×3 IMPLANT
GLOVE BIOGEL PI IND STRL 7.0 (GLOVE) IMPLANT
GLOVE BIOGEL PI IND STRL 7.5 (GLOVE) IMPLANT
GLOVE BIOGEL PI IND STRL 8 (GLOVE) IMPLANT
GLOVE BIOGEL PI INDICATOR 7.0 (GLOVE) ×2
GLOVE BIOGEL PI INDICATOR 7.5 (GLOVE) ×2
GLOVE BIOGEL PI INDICATOR 8 (GLOVE) ×4
GLOVE ECLIPSE 7.5 STRL STRAW (GLOVE) ×6 IMPLANT
GLOVE INDICATOR 8.5 STRL (GLOVE) ×3 IMPLANT
GOWN STRL REUS W/ TWL LRG LVL3 (GOWN DISPOSABLE) ×1 IMPLANT
GOWN STRL REUS W/ TWL XL LVL3 (GOWN DISPOSABLE) ×2 IMPLANT
GOWN STRL REUS W/TWL 2XL LVL3 (GOWN DISPOSABLE) ×2 IMPLANT
GOWN STRL REUS W/TWL LRG LVL3 (GOWN DISPOSABLE) ×3
GOWN STRL REUS W/TWL XL LVL3 (GOWN DISPOSABLE) ×3
KIT BASIN OR (CUSTOM PROCEDURE TRAY) ×3 IMPLANT
KIT TURNOVER KIT B (KITS) ×3 IMPLANT
NDL SPNL 22GX3.5 QUINCKE BK (NEEDLE) ×1 IMPLANT
NEEDLE HYPO 22GX1.5 SAFETY (NEEDLE) ×3 IMPLANT
NEEDLE SPNL 22GX3.5 QUINCKE BK (NEEDLE) ×3 IMPLANT
NS IRRIG 1000ML POUR BTL (IV SOLUTION) ×3 IMPLANT
OIL CARTRIDGE MAESTRO DRILL (MISCELLANEOUS) ×3
PACK LAMINECTOMY NEURO (CUSTOM PROCEDURE TRAY) ×3 IMPLANT
SPONGE SURGIFOAM ABS GEL SZ50 (HEMOSTASIS) ×3 IMPLANT
STRIP CLOSURE SKIN 1/2X4 (GAUZE/BANDAGES/DRESSINGS) ×2 IMPLANT
SUT VIC AB 0 CT1 18XCR BRD8 (SUTURE) ×1 IMPLANT
SUT VIC AB 0 CT1 8-18 (SUTURE) ×3
SUT VIC AB 2-0 CT1 18 (SUTURE) ×3 IMPLANT
SUT VICRYL 4-0 PS2 18IN ABS (SUTURE) ×3 IMPLANT
TOWEL GREEN STERILE (TOWEL DISPOSABLE) ×3 IMPLANT
TOWEL GREEN STERILE FF (TOWEL DISPOSABLE) ×3 IMPLANT
WATER STERILE IRR 1000ML POUR (IV SOLUTION) ×3 IMPLANT

## 2019-10-25 NOTE — H&P (Signed)
Justin Coleman is an 86 y.o. male.   Chief Complaint: Back and left hip and leg pain HPI: 86 year old gentleman with left L3 radicular symptoms work-up has revealed disc herniation extraforaminal L3-4 on the left due to patient's progression of clinical syndrome imaging findings and failed conservative treatment I recommended extraforaminal microdiscectomy at L3-4 on the left.  I extensively gone over the risks and benefits of that operation with him as well as perioperative course expectations of outcome and alternatives of surgery and he understands and agrees to proceed forward.  Past Medical History:  Diagnosis Date  . Arthritis   . BPH (benign prostatic hyperplasia)   . Coronary artery disease    non obstructive  . Dyspnea    with exertion  . GERD (gastroesophageal reflux disease)   . Hypertension   . Sleep apnea     Past Surgical History:  Procedure Laterality Date  . APPENDECTOMY    . BACK SURGERY  1993  . CARDIAC CATHETERIZATION    . CERVICAL FUSION     age 86   . COLONOSCOPY    . EYE SURGERY Bilateral    cataract  . LEFT HEART CATH AND CORONARY ANGIOGRAPHY N/A 06/27/2019   Procedure: LEFT HEART CATH AND CORONARY ANGIOGRAPHY;  Surgeon: Justin Decamp, MD;  Location: [facility];  Service: Cardiovascular;  Laterality: N/A;  . testicular growth    . TONSILLECTOMY    . TOTAL KNEE ARTHROPLASTY Bilateral 2006/2007    Family History  Problem Relation Age of Onset  . Hypertension Mother    Social History:  reports that he quit smoking about 54 years ago. His smoking use included cigarettes. He has a 20.00 pack-year smoking history. He has never used smokeless tobacco. He reports that he does not drink alcohol and does not use drugs.  Allergies:  Allergies  Allergen Reactions  . Oxycodone Nausea Only    Abdominal pain    Medications Prior to Admission  Medication Sig Dispense Refill  . amLODipine (NORVASC) 10 MG tablet TAKE 1 TABLET BY MOUTH DAILY (Patient taking  differently: Take 5 mg by mouth every evening. ) 90 tablet 0  . amoxicillin (AMOXIL) 500 MG capsule Take 2,000 mg by mouth See admin instructions. TAKE 4 CAPSULES (2000 MG) BY MOUTH 1 HOUR PRIOR TO DENTAL APPOINTMENTS.    . hydrochlorothiazide (HYDRODIURIL) 50 MG tablet Take 50 mg by mouth daily.     . isosorbide mononitrate (IMDUR) 30 MG 24 hr tablet Take 15 mg by mouth every evening.     Marland Kitchen losartan (COZAAR) 100 MG tablet Take 100 mg by mouth every evening.     . metoprolol succinate (TOPROL XL) 50 MG 24 hr tablet Take 1 tablet (50 mg total) by mouth every morning. Hold if systolic blood pressure (top blood pressure number) less than 100 mmHg or heart rate less than 60 bpm (pulse). (Patient taking differently: Take 50 mg by mouth at bedtime. Hold if systolic blood pressure (top blood pressure number) less than 100 mmHg or heart rate less than 60 bpm (pulse).) 90 tablet 1  . nitroGLYCERIN (NITROSTAT) 0.4 MG SL tablet Place 0.4 mg under the tongue every 5 (five) minutes x 3 doses as needed for chest pain.     Marland Kitchen psyllium (METAMUCIL) 58.6 % powder Take 1 packet by mouth daily as needed (Constipation).     . tamsulosin (FLOMAX) 0.4 MG CAPS capsule Take 0.8 mg by mouth daily after supper. 30 minutes after supper    .  aspirin EC 81 MG tablet Take 81 mg by mouth daily.      Results for orders placed or performed during the hospital encounter of 10/24/19 (from the past 48 hour(s))  SARS CORONAVIRUS 2 (TAT 6-24 HRS) Nasopharyngeal Nasopharyngeal Swab     Status: None   Collection Time: 10/24/19 12:57 PM   Specimen: Nasopharyngeal Swab  Result Value Ref Range   SARS Coronavirus 2 NEGATIVE NEGATIVE    Comment: (NOTE) SARS-CoV-2 target nucleic acids are NOT DETECTED.  The SARS-CoV-2 RNA is generally detectable in upper and lower respiratory specimens during the acute phase of infection. Negative results do not preclude SARS-CoV-2 infection, do not rule out co-infections with other pathogens, and should  not be used as the sole basis for treatment or other patient management decisions. Negative results must be combined with clinical observations, patient history, and epidemiological information. The expected result is Negative.  Fact Sheet for Patients: HairSlick.no  Fact Sheet for Healthcare Providers: quierodirigir.com  This test is not yet approved or cleared by the [place] FDA and  has been authorized for detection and/or diagnosis of SARS-CoV-2 by FDA under an Emergency Use Authorization (EUA). This EUA will remain  in effect (meaning this test can be used) for the duration of the COVID-19 declaration under Se ction 564(b)(1) of the Act, 21 U.S.C. section 360bbb-3(b)(1), unless the authorization is terminated or revoked sooner.  Performed at [facility], [address] [place], [place] [address]    No results found.  Review of Systems  Musculoskeletal: Positive for back pain.  Neurological: Positive for weakness and numbness.    Blood pressure (!) 197/49, pulse 68, temperature 97.8 F (36.6 C), temperature source Oral, resp. rate 18, height 5\' 10"  (1.778 m), weight 128.4 kg, SpO2 97 %. Physical Exam HENT:     Head: Normocephalic.     Nose: Nose normal.     Mouth/Throat:     Mouth: Mucous membranes are moist.  Eyes:     Pupils: Pupils are equal, round, and reactive to light.  Cardiovascular:     Rate and Rhythm: Normal rate.  Pulmonary:     Effort: Pulmonary effort is normal.  Abdominal:     General: Abdomen is flat.  Musculoskeletal:        General: Normal range of motion.  Skin:    General: Skin is warm.     Capillary Refill: Capillary refill takes less than 2 seconds.  Neurological:     Mental Status: He is alert.     Cranial Nerves: Cranial nerves are intact.     Comments: Patient is awake and alert strength is 5 and 5 iliopsoas, quads, hamstrings, gastroc, into tibialis, and EHL.       Assessment/Plan 86 year old gentleman presents for a left L3-4 extraforaminal microdiscectomy  83, MD 10/25/2019, 7:12 AM

## 2019-10-25 NOTE — Discharge Instructions (Signed)

## 2019-10-25 NOTE — Op Note (Signed)
Preoperative diagnosis: Herniated nucleus pulposus L3-4 extraforaminal left with left L3 radiculopathy  Postoperative diagnosis: Same  Procedure: Extraforaminal approach microdiscectomy L3-4 on the left with microdissection of the left L3 nerve root microscopic discectomy  Surgeon: Jillyn Hidden Khaled Herda  Assistant: Julien Girt  Anesthesia: General  EBL: Minimal  HPI: 86 year old with progressive worsening left hip and leg pain rating down to his lateral and anterior quad work-up revealed lateral recess stenosis and an extraforaminal disc radiation at L3-4 on the left.  Due to patient's progression of clinical syndrome imaging findings failed conservative treatment I recommended an extraforaminal discectomy at L3-4 on the left I extensively went over the risks and benefits of that operation with him as well as perioperative course expectations of outcome and alternatives of surgery and he understood and agreed to proceed forward.  Operative procedure: Patient was brought into the OR was due to general anesthesia positioned prone Wilson frame his back was prepped and draped in routine sterile fashion preoperative x-ray localized the appropriate level so after infiltration of 10 cc lidocaine with epi midline incision was made and Bovie letter cautery was used take down the subcutaneous tissue and subperiosteal dissection was carried lamina of L3 expose the lateral pars at 3 and the 2 3 facet joint.  Intraoperative x-ray identified the L3 pedicle so working just below the L3 transverse process on the lateral pars to the superior aspect of the 3 4 facet this was all drilled down with a high-speed drill.  Under biting the lateral pars undersurface of the 2 3 facet and superior aspect of the 3 4 facet was carried out with a 2 mm Kerrison punch.  There was a large spur coming off the superior aspect of 3 4 facet this was all under Bitton.  The intertransverse ligament was identified and removed in piecemeal fashion.   Identified identified the L3 nerve root and working below this large free fragment disc herniation was immediately identified and this was removed with pituitary rongeurs.  I then incised the disc opened it up and cleaned out the disc removing several additional fragments from within the disc base as well as inferior to the L3 nerve at the end of defect discectomy there was no further stenosis in the extraforaminal space I was easily able to pass a coronary dilator proximally and distally out the L3 nerve as well as medially underneath the annulus to confirm no further fragments medially.  Wound was then copiously irrigated to Kassim states was maintained Gelfoam was overlaid top of the dura the muscle fascia approximate layers with Vicryl and the skin was closed with a running 4 subcuticular Dermabond benzoin Steri-Strips and a sterile dressing was applied patient recovery room in stable condition.  At the end the case all needle counts and sponge counts were correct.

## 2019-10-25 NOTE — Transfer of Care (Signed)
Immediate Anesthesia Transfer of Care Note  Patient: Justin Coleman  Procedure(s) Performed: Left Extraforaminal Lumbar Three-Four Microdiscectomy (Left Spine Lumbar)  Patient Location: PACU  Anesthesia Type:General  Level of Consciousness: awake, alert  and patient cooperative  Airway & Oxygen Therapy: Patient Spontanous Breathing  Post-op Assessment: Report given to RN and Post -op Vital signs reviewed and stable  Post vital signs: Reviewed and stable  Last Vitals:  Vitals Value Taken Time  BP 129/60 10/25/19 0914  Temp    Pulse 75 10/25/19 0916  Resp 19 10/25/19 0916  SpO2 92 % 10/25/19 0916  Vitals shown include unvalidated device data.  Last Pain:  Vitals:   10/25/19 0559  TempSrc: Oral  PainSc: 4       Patients Stated Pain Goal: 2 (10/25/19 0559)  Complications: No complications documented.

## 2019-10-25 NOTE — Discharge Summary (Signed)
Physician Discharge Summary  Patient ID: Justin Coleman MRN: [id number] DOB/AGE: 04/15/1932 86 y.o.  Admit date: 10/25/2019 Discharge date: 10/25/2019  Admission Diagnoses: Herniated nucleus pulposus L3-4 extraforaminal left with left L3 radiculopathy    Discharge Diagnoses: same   Discharged Condition: good  Hospital Course: The patient was admitted on 10/25/2019 and taken to the operating room where the patient underwent extraforaminal microdiscectomy L3-4 left. The patient tolerated the procedure well and was taken to the recovery room and then to the floor in stable condition. The hospital course was routine. There were no complications. The wound remained clean dry and intact. Pt had appropriate back soreness. No complaints of leg pain or new N/T/W. The patient remained afebrile with stable vital signs, and tolerated a regular diet. The patient continued to increase activities, and pain was well controlled with oral pain medications.   Consults: None  Significant Diagnostic Studies:  Results for orders placed or performed during the hospital encounter of 10/24/19  SARS CORONAVIRUS 2 (TAT 6-24 HRS) Nasopharyngeal Nasopharyngeal Swab   Specimen: Nasopharyngeal Swab  Result Value Ref Range   SARS Coronavirus 2 NEGATIVE NEGATIVE    DG Lumbar Spine 2-3 Views  Result Date: 10/25/2019 CLINICAL DATA:  Microdiscectomy EXAM: LUMBAR SPINE - 2-3 VIEW COMPARISON:  Lumbar radiographs April 11, 2015 FINDINGS: On cross-table lateral image labeled #1, there is a metallic probe posterior to the L3-4 level. There is moderate disc space narrowing at L2-3, L3-4, L4-5, and L5-S1. No fracture or spondylolisthesis. On cross-table lateral lumbar image labeled #2, metallic probe tip is posterior to the mid L3 level. Cutting tool is position between the L2 and L3 spinous processes. There is disc space narrowing at L2-3, L3-4, L4-5, and L5-S1. No fracture or spondylolisthesis. IMPRESSION: On final submitted  image labeled #2, metallic probe tip is posterior to the mid L3 vertebral body level. There is multilevel osteoarthritic change. No fracture or spondylolisthesis. Electronically Signed   By: Bretta Bang III M.D.   On: 10/25/2019 10:48    Antibiotics:  Anti-infectives (From admission, onward)   Start     Dose/Rate Route Frequency Ordered Stop   10/25/19 1130  ceFAZolin (ANCEF) IVPB 2g/100 mL premix        2 g 200 mL/hr over 30 Minutes Intravenous Every 8 hours 10/25/19 1043 10/26/19 0329   10/25/19 1042  amoxicillin (AMOXIL) capsule 2,000 mg  Status:  Discontinued       Note to Pharmacy: TAKE 4 CAPSULES (2000 MG) BY MOUTH 1 HOUR PRIOR TO DENTAL APPOINTMENTS.     2,000 mg Oral See admin instructions 10/25/19 1042 10/25/19 1051   10/25/19 0830  bacitracin 50,000 Units in sodium chloride 0.9 % 500 mL irrigation  Status:  Discontinued          As needed 10/25/19 0830 10/25/19 0910   10/25/19 0600  ceFAZolin (ANCEF) 3 g in dextrose 5 % 50 mL IVPB        3 g 100 mL/hr over 30 Minutes Intravenous On call to O.R. 10/24/19 9326 10/25/19 0755      Discharge Exam: Blood pressure 136/68, pulse 65, temperature 97.7 F (36.5 C), resp. rate 18, height 5\' 10"  (1.778 m), weight 128.4 kg, SpO2 96 %. Neurologic: Grossly normal Ambulating and voiding well, incision cdi  Discharge Medications:   Allergies as of 10/25/2019      Reactions   Oxycodone Nausea Only   Abdominal pain      Medication List    TAKE these medications  amLODipine 10 MG tablet Commonly known as: NORVASC TAKE 1 TABLET BY MOUTH DAILY What changed:   how much to take  when to take this   amoxicillin 500 MG capsule Commonly known as: AMOXIL Take 2,000 mg by mouth See admin instructions. TAKE 4 CAPSULES (2000 MG) BY MOUTH 1 HOUR PRIOR TO DENTAL APPOINTMENTS.   aspirin EC 81 MG tablet Take 81 mg by mouth daily.   hydrochlorothiazide 50 MG tablet Commonly known as: HYDRODIURIL Take 50 mg by mouth daily.    isosorbide mononitrate 30 MG 24 hr tablet Commonly known as: IMDUR Take 15 mg by mouth every evening.   losartan 100 MG tablet Commonly known as: COZAAR Take 100 mg by mouth every evening.   methocarbamol 500 MG tablet Commonly known as: Robaxin Take 1 tablet (500 mg total) by mouth 4 (four) times daily.   metoprolol succinate 50 MG 24 hr tablet Commonly known as: Toprol XL Take 1 tablet (50 mg total) by mouth every morning. Hold if systolic blood pressure (top blood pressure number) less than 100 mmHg or heart rate less than 60 bpm (pulse). What changed: when to take this   nitroGLYCERIN 0.4 MG SL tablet Commonly known as: NITROSTAT Place 0.4 mg under the tongue every 5 (five) minutes x 3 doses as needed for chest pain.   psyllium 58.6 % powder Commonly known as: METAMUCIL Take 1 packet by mouth daily as needed (Constipation).   tamsulosin 0.4 MG Caps capsule Commonly known as: FLOMAX Take 0.8 mg by mouth daily after supper. 30 minutes after supper       Disposition: home   Final Dx: Left L3-4 extraforaminal microdiscectomy       Signed: Tiana Loft [facility] 10/25/2019, 2:30 PM

## 2019-10-25 NOTE — Evaluation (Addendum)
I agree with the following treatment note after review of the documentation. This session was performed under the supervision of a licensed clinician.   Cindee Salt, DPT  Acute Rehabilitation Services  Pager: [phone]  Physical Therapy Evaluation & Discharge Patient Details Name: Justin Coleman MRN: [id number] DOB: 09/23/1932 Today's Date: 10/25/2019   History of Present Illness  pt is a 86 yo male who is s/p microdiscectomy of L3-4. Pt has a PMH of BPH, CAD, Dyspnea, HTN, Bilateral TKA and L heart cath and angiography    Clinical Impression  Pt was evaluated and assessed for the above diagnosis and impairments below. Pt required supervision to min guard for all mobility tasks assessed. Pt has great family support at home and reports he is close to baseline. Educated about back precautions and generalized walking program. Pt with no further acute PT needs. Will sign off on this patient. If needs change, please reconsult.     Follow Up Recommendations No PT follow up    Equipment Recommendations  None recommended by PT    Recommendations for Other Services       Precautions / Restrictions Precautions Precautions: Back Precaution Booklet Issued: Yes (comment) Precaution Comments: pt edcutaed on spinal precautions Restrictions Weight Bearing Restrictions: No      Mobility  Bed Mobility Overal bed mobility: Needs Assistance Bed Mobility: Rolling;Sidelying to Sit Rolling: Supervision Sidelying to sit: HOB elevated;Supervision       General bed mobility comments: pt required supervision for safety. Cues for log roll technique  Transfers Overall transfer level: Needs assistance Equipment used: None Transfers: Sit to/from Stand Sit to Stand: Supervision         General transfer comment: pt required supervision for safety with sit<>stand transfer. pt required increase time with power up to standing  Ambulation/Gait Ambulation/Gait assistance: Supervision Gait  Distance (Feet): 200 Feet Assistive device: None Gait Pattern/deviations: Step-through pattern;Decreased stride length;Wide base of support Gait velocity: slightly slower    General Gait Details: pt required supervision for safety. Pt noted to have slightly slowed speed with lateral trunk shift to the left with each step. Pt reported to be at baseline ambulation speed  Stairs Stairs: Yes Stairs assistance: Min guard Stair Management: One rail Left;Alternating pattern;Forwards Number of Stairs: 6 General stair comments: pt required min guard with stair navigation. pt demonstrated an alternating pattern throughout. No LOB noted.   Wheelchair Mobility    Modified Rankin (Stroke Patients Only)       Balance Overall balance assessment: Needs assistance Sitting-balance support: Feet supported;No upper extremity supported Sitting balance-Leahy Scale: Good     Standing balance support: During functional activity;No upper extremity supported Standing balance-Leahy Scale: Good                               Pertinent Vitals/Pain Pain Assessment: No/denies pain    Home Living Family/patient expects to be discharged to:: Private residence Living Arrangements: Spouse/significant other Available Help at Discharge: Family Type of Home: House Home Access: Stairs to enter Entrance Stairs-Rails: Left Entrance Stairs-Number of Steps: 4 Home Layout: One level Home Equipment: Environmental consultant - 2 wheels;Shower seat      Prior Function Level of Independence: Independent               Hand Dominance        Extremity/Trunk Assessment   Upper Extremity Assessment Upper Extremity Assessment: Overall WFL for tasks assessed    Lower  Extremity Assessment Lower Extremity Assessment: Overall WFL for tasks assessed    Cervical / Trunk Assessment Cervical / Trunk Assessment: Other exceptions (s/p lumbar surgery)  Communication   Communication: No difficulties  Cognition  Arousal/Alertness: Awake/alert Behavior During Therapy: WFL for tasks assessed/performed Overall Cognitive Status: Within Functional Limits for tasks assessed                       General Comments General comments (skin integrity, edema, etc.): pt educated on walking program    Exercises     Assessment/Plan    PT Assessment Patent does not need any further PT services  PT Problem List         PT Treatment Interventions      PT Goals (Current goals can be found in the Care Plan section)  Acute Rehab PT Goals Patient Stated Goal: get home PT Goal Formulation: With patient/family Time For Goal Achievement: 10/25/19 Potential to Achieve Goals: Good    Frequency     Barriers to discharge        Co-evaluation               AM-PAC PT "6 Clicks" Mobility  Outcome Measure Help needed turning from your back to your side while in a flat bed without using bedrails?: None Help needed moving from lying on your back to sitting on the side of a flat bed without using bedrails?: None Help needed moving to and from a bed to a chair (including a wheelchair)?: None Help needed standing up from a chair using your arms (e.g., wheelchair or bedside chair)?: None Help needed to walk in hospital room?: None Help needed climbing 3-5 steps with a railing? : A Little 6 Click Score: 23    End of Session Equipment Utilized During Treatment: Gait belt Activity Tolerance: Patient tolerated treatment well Patient left: in bed;with family/visitor present;with call bell/phone within reach Nurse Communication: Mobility status PT Visit Diagnosis: Other abnormalities of gait and mobility (R26.89)    Time: 9381-0175 PT Time Calculation (min) (ACUTE ONLY): 20 min   Charges:   PT Evaluation $PT Eval Low Complexity: 1 Low         Harmon Pier, SPT  Acute Rehabilitation Services  Office: [phone]  10/25/2019, 1:33 PM

## 2019-10-25 NOTE — Plan of Care (Signed)
Pt doing well. Pt and wife given D/C instructions with verbal understanding. Rx was e-prescribed by MD. Pt's incision is clean and dry with no sign of infection. Pt's IV was removed prior to D/C. Pt D/C'd home via wheelchair per MD order. Pt is stable @ D/C and has no other needs at this time. Rema Fendt, RN

## 2019-10-25 NOTE — Anesthesia Procedure Notes (Signed)
Procedure Name: Intubation Date/Time: 10/25/2019 7:45 AM Performed by: Janace Litten, CRNA Pre-anesthesia Checklist: Patient identified, Emergency Drugs available, Suction available and Patient being monitored Patient Re-evaluated:Patient Re-evaluated prior to induction Oxygen Delivery Method: Circle System Utilized Preoxygenation: Pre-oxygenation with 100% oxygen Induction Type: IV induction Ventilation: Mask ventilation without difficulty Laryngoscope Size: Mac and 4 Grade View: Grade II Tube type: Oral Tube size: 7.5 mm Number of attempts: 1 Airway Equipment and Method: Stylet Placement Confirmation: ETT inserted through vocal cords under direct vision,  positive ETCO2 and breath sounds checked- equal and bilateral Secured at: 23 cm Tube secured with: Tape Dental Injury: Teeth and Oropharynx as per pre-operative assessment

## 2019-10-25 NOTE — Anesthesia Postprocedure Evaluation (Signed)
Anesthesia Post Note  Patient: Justin Coleman  Procedure(s) Performed: Left Extraforaminal Lumbar Three-Four Microdiscectomy (Left Spine Lumbar)     Patient location during evaluation: PACU Anesthesia Type: General Level of consciousness: awake and alert, patient cooperative and oriented Pain management: pain level controlled Vital Signs Assessment: post-procedure vital signs reviewed and stable Respiratory status: spontaneous breathing, nonlabored ventilation and respiratory function stable Cardiovascular status: blood pressure returned to baseline and stable Postop Assessment: no apparent nausea or vomiting Anesthetic complications: no   No complications documented.  Last Vitals:  Vitals:   10/25/19 1015 10/25/19 1042  BP: (!) 115/56 136/68  Pulse: 67 65  Resp: 13 18  Temp: 36.5 C   SpO2: 95% 96%    Last Pain:  Vitals:   10/25/19 1050  TempSrc:   PainSc: 0-No pain                 [name],E. Kinte Trim

## 2019-10-26 ENCOUNTER — Encounter (HOSPITAL_COMMUNITY): Payer: Self-pay | Admitting: Neurosurgery

## 2019-10-30 ENCOUNTER — Ambulatory Visit: Payer: Medicare PPO | Admitting: Cardiology

## 2019-12-01 ENCOUNTER — Telehealth: Payer: Self-pay

## 2019-12-01 ENCOUNTER — Encounter: Payer: Self-pay | Admitting: Cardiology

## 2019-12-01 ENCOUNTER — Ambulatory Visit: Payer: Medicare PPO | Admitting: Cardiology

## 2019-12-01 ENCOUNTER — Other Ambulatory Visit: Payer: Self-pay

## 2019-12-01 VITALS — BP 121/59 | HR 74 | Ht 70.0 in | Wt 286.0 lb

## 2019-12-01 DIAGNOSIS — I2510 Atherosclerotic heart disease of native coronary artery without angina pectoris: Secondary | ICD-10-CM

## 2019-12-01 DIAGNOSIS — G4733 Obstructive sleep apnea (adult) (pediatric): Secondary | ICD-10-CM

## 2019-12-01 DIAGNOSIS — Z6841 Body Mass Index (BMI) 40.0 and over, adult: Secondary | ICD-10-CM

## 2019-12-01 DIAGNOSIS — E66813 Obesity, class 3: Secondary | ICD-10-CM

## 2019-12-01 DIAGNOSIS — I208 Other forms of angina pectoris: Secondary | ICD-10-CM

## 2019-12-01 DIAGNOSIS — I10 Essential (primary) hypertension: Secondary | ICD-10-CM

## 2019-12-01 DIAGNOSIS — I2089 Other forms of angina pectoris: Secondary | ICD-10-CM

## 2019-12-01 DIAGNOSIS — Z87891 Personal history of nicotine dependence: Secondary | ICD-10-CM

## 2019-12-01 DIAGNOSIS — Z9989 Dependence on other enabling machines and devices: Secondary | ICD-10-CM

## 2019-12-01 MED ORDER — AMLODIPINE BESYLATE 5 MG PO TABS: 5.0000 mg | ORAL_TABLET | Freq: Every evening | ORAL | 1 refills | Status: DC

## 2019-12-01 NOTE — Progress Notes (Signed)
Justin Coleman Date of Birth: [birth year] MRN: [id number] Primary Care Provider:Elkins, Curt Jews, MD Former Cardiology Providers: Dr. Wynonia Lawman  Primary Cardiologist: Rex Kras, DO, [facility] (established care 06/19/2019)  Date: 12/17/19 Last Office Visit: 07/04/2019  Chief Complaint  Patient presents with  . Chest Pain    Reevaluation of chest pain  . Follow-up  . Results   HPI  Justin Coleman is a 86 y.o. male who presents to the office with a chief complaint of " reevaluation of chest pain." Patient's past medical history and cardiac risk factors include: Coronary artery disease, microvascular angina, hypertension, former smoker, sleep apnea, advanced age, obesity.   Patient is accompanied by his wife at today's office visit.  Patient was referred to the office back in April 2021 at the request of his primary care for evaluation of chest pain and shortness of breath.  Given his symptoms and risk factors patient had undergone LHC on 06/27/2019 and was noted have microvascular angina and possible coronary spasm.   Since last visit patient has been doing well from cardiovascular standpoint. He has not used sublingual nitro. He also underwent surgery back in August 2021 and postoperatively has done well.  Independently reviewed outside records including lab results which are noted below for further reference.  At last office visit he was recommended to undergo sleep study and he is found to have severe sleep apnea and is pending CPAP machine.  Denies prior history of myocardial infarction, congestive heart failure, deep venous thrombosis, pulmonary embolism, stroke, transient ischemic attack.  FUNCTIONAL STATUS: Does his best to maintain his vegetable garden (but has been difficult over the last 6 month).    ALLERGIES: Allergies  Allergen Reactions  . Oxycodone Nausea Only    Abdominal pain    MEDICATION LIST PRIOR TO VISIT: Current Outpatient Medications on File Prior to  Visit  Medication Sig Dispense Refill  . amoxicillin (AMOXIL) 500 MG capsule Take 2,000 mg by mouth See admin instructions. TAKE 4 CAPSULES (2000 MG) BY MOUTH 1 HOUR PRIOR TO DENTAL APPOINTMENTS.    Marland Kitchen aspirin EC 81 MG tablet Take 81 mg by mouth daily.    . hydrochlorothiazide (HYDRODIURIL) 50 MG tablet Take 50 mg by mouth daily.     . isosorbide mononitrate (IMDUR) 30 MG 24 hr tablet Take 15 mg by mouth every evening.     Marland Kitchen losartan (COZAAR) 100 MG tablet Take 100 mg by mouth every evening.     . methocarbamol (ROBAXIN) 500 MG tablet Take 1 tablet (500 mg total) by mouth 4 (four) times daily. 45 tablet 0  . metoprolol succinate (TOPROL XL) 50 MG 24 hr tablet Take 1 tablet (50 mg total) by mouth every morning. Hold if systolic blood pressure (top blood pressure number) less than 100 mmHg or heart rate less than 60 bpm (pulse). (Patient taking differently: Take 50 mg by mouth at bedtime. Hold if systolic blood pressure (top blood pressure number) less than 100 mmHg or heart rate less than 60 bpm (pulse).) 90 tablet 1  . nitroGLYCERIN (NITROSTAT) 0.4 MG SL tablet Place 0.4 mg under the tongue every 5 (five) minutes x 3 doses as needed for chest pain.     Marland Kitchen psyllium (METAMUCIL) 58.6 % powder Take 1 packet by mouth daily as needed (Constipation).     . tamsulosin (FLOMAX) 0.4 MG CAPS capsule Take 0.8 mg by mouth daily after supper. 30 minutes after supper     No current facility-administered medications on file prior  to visit.    PAST MEDICAL HISTORY: Past Medical History:  Diagnosis Date  . Arthritis   . BPH (benign prostatic hyperplasia)   . Coronary artery disease    non obstructive  . Dyspnea    with exertion  . GERD (gastroesophageal reflux disease)   . Hypertension   . Lumbago   . Sleep apnea     PAST SURGICAL HISTORY: Past Surgical History:  Procedure Laterality Date  . APPENDECTOMY    . BACK SURGERY  1993  . CARDIAC CATHETERIZATION    . CERVICAL FUSION     age 52   .  COLONOSCOPY    . EYE SURGERY Bilateral    cataract  . LEFT HEART CATH AND CORONARY ANGIOGRAPHY N/A 06/27/2019   Procedure: LEFT HEART CATH AND CORONARY ANGIOGRAPHY;  Surgeon: Adrian Prows, MD;  Location: [facility];  Service: Cardiovascular;  Laterality: N/A;  . LUMBAR LAMINECTOMY/DECOMPRESSION MICRODISCECTOMY Left 10/25/2019   Procedure: Left Extraforaminal Lumbar Three-Four Microdiscectomy;  Surgeon: Kary Kos, MD;  Location: [place] [facility];  Service: Neurosurgery;  Laterality: Left;  . testicular growth    . TONSILLECTOMY    . TOTAL KNEE ARTHROPLASTY Bilateral 2006/2007    FAMILY HISTORY: The patient family history includes Hypertension in his mother.   SOCIAL HISTORY:  The patient  reports that he quit smoking about 54 years ago. His smoking use included cigarettes. He has a 20.00 pack-year smoking history. He has never used smokeless tobacco. He reports that he does not drink alcohol and does not use drugs.  REVIEW OF SYSTEMS: Review of Systems  Constitutional: Negative for chills and fever.  HENT: Negative for ear discharge, ear pain and nosebleeds.   Eyes: Negative for blurred vision and discharge.  Cardiovascular: Positive for dyspnea on exertion (chronic and stable. ). Negative for chest pain, claudication, leg swelling, near-syncope, orthopnea, palpitations, paroxysmal nocturnal dyspnea and syncope.  Respiratory: Negative for cough and shortness of breath.   Endocrine: Negative for polydipsia, polyphagia and polyuria.  Hematologic/Lymphatic: Negative for bleeding problem.  Skin: Negative for flushing and nail changes.  Musculoskeletal: Negative for muscle cramps, muscle weakness and myalgias.  Gastrointestinal: Negative for abdominal pain, dysphagia, hematemesis, hematochezia, melena, nausea and vomiting.  Neurological: Negative for dizziness, focal weakness and light-headedness.   PHYSICAL EXAM: Vitals with BMI 12/01/2019 10/25/2019 10/25/2019  Height $Remov'5\' 10"'dEIdFV$  - -  Weight 286 lbs  - -  BMI 91.69 - -  Systolic 450 388 828  Diastolic 59 68 56  Pulse 74 65 67   CONSTITUTIONAL: Well-developed and well-nourished. No acute distress.  SKIN: Skin is warm and dry. No rash noted. No cyanosis. No pallor. No jaundice HEAD: Normocephalic and atraumatic.  EYES: No scleral icterus MOUTH/THROAT: Moist oral membranes.  NECK: No JVD present. No thyromegaly noted. No carotid bruits  LYMPHATIC: No visible cervical adenopathy.  CHEST Normal respiratory effort. No intercostal retractions  LUNGS: Clear to auscultation bilaterally.  No stridor. No wheezes. No rales.  CARDIOVASCULAR: Regular positive S1-S2, no murmurs rubs or gallops appreciated ABDOMINAL: Obese, soft, nontender, nondistended, positive bowel sounds in all 4 quadrants, no apparent ascites.  EXTREMITIES: No peripheral edema  HEMATOLOGIC: No significant bruising NEUROLOGIC: Oriented to person, place, and time. Nonfocal. Normal muscle tone.  PSYCHIATRIC: Normal mood and affect. Normal behavior. Cooperative  CARDIAC DATABASE: EKG: 06/19/2019: Normal sinus rhythm, 82 bpm, poor R wave progression, possible old anterior and inferior infarct, no active myocardial injury pattern.  Echocardiogram: 06/22/2019: LVEF 60-65%, moderate LVH, indeterminate diastolic filling pattern, elevated  left atrial pressure, aortic valve sclerosis without stenosis, mild MR, mild TR.  Stress Testing: None  Heart Catheterization: 06/27/19:  Normal LV systolic function, normal EDP. Right coronary artery with a mid 20% stenosis.  Significant improvement in flow with nitroglycerin administration suggestive of mild coronary spasm and microvascular angina. Left main normal. Circumflex moderate size, normal. Ramus intermediate very small, normal. LAD large, smooth and normal, gives origin to a small to moderate-sized D1 which is a ostial 60% stenosis which is 1.5 mm in diameter. Recommendation: Chest pain symptoms could be related to microvascular  angina and also possibly coronary spasm.  Patient will be discharged home on amlodipine in addition to the current medical regimen.  45 mL contrast utilized.  LABORATORY DATA: CBC Latest Ref Rng & Units 10/24/2019 06/27/2019 12/06/2008  WBC 4.0 - 10.5 K/uL 8.0 5.2 5.0  Hemoglobin 13.0 - 17.0 g/dL 14.0 14.1 14.5  Hematocrit 39 - 52 % 42.9 42.0 42.8  Platelets 150 - 400 K/uL 216 216 184    CMP Latest Ref Rng & Units 10/24/2019 06/19/2019 12/06/2008  Glucose 70 - 99 mg/dL 119(H) 114(H) 98  BUN 8 - 23 mg/dL $Remove'19 21 19  'oKtefju$ Creatinine 0.61 - 1.24 mg/dL 0.86 0.84 0.87  Sodium 135 - 145 mmol/L 139 140 140  Potassium 3.5 - 5.1 mmol/L 3.5 4.1 3.8  Chloride 98 - 111 mmol/L 104 101 103  CO2 22 - 32 mmol/L $RemoveB'26 25 31  'oyhlxTXi$ Calcium 8.9 - 10.3 mg/dL 9.3 9.6 9.5  Total Protein 6.0 - 8.5 g/dL - 6.7 -  Total Bilirubin 0.0 - 1.2 mg/dL - 0.3 -  Alkaline Phos 39 - 117 IU/L - 64 -  AST 0 - 40 IU/L - 17 -  ALT 0 - 44 IU/L - 16 -    Lipid Panel     Component Value Date/Time   CHOL 150 07/05/2019 1012   TRIG 141 07/05/2019 1012   HDL 39 (L) 07/05/2019 1012   LDLCALC 86 07/05/2019 1012   LABVLDL 25 07/05/2019 1012    Lab Results  Component Value Date   HGBA1C 6.1 (H) 10/24/2019   No components found for: NTPROBNP No results found for: TSH  External Labs: Collected: 08/11/2019 Creatinine 0.73 mg/dL. eGFR: 85 mL/min per 1.73 m AST 16, ALT 16 alkaline phosphatase 61  FINAL MEDICATION LIST END OF ENCOUNTER: Meds ordered this encounter  Medications  . amLODipine (NORVASC) 5 MG tablet    Sig: Take 1 tablet (5 mg total) by mouth every evening.    Dispense:  90 tablet    Refill:  1     Current Outpatient Medications:  .  amoxicillin (AMOXIL) 500 MG capsule, Take 2,000 mg by mouth See admin instructions. TAKE 4 CAPSULES (2000 MG) BY MOUTH 1 HOUR PRIOR TO DENTAL APPOINTMENTS., Disp: , Rfl:  .  aspirin EC 81 MG tablet, Take 81 mg by mouth daily., Disp: , Rfl:  .  hydrochlorothiazide (HYDRODIURIL) 50 MG tablet,  Take 50 mg by mouth daily. , Disp: , Rfl:  .  isosorbide mononitrate (IMDUR) 30 MG 24 hr tablet, Take 15 mg by mouth every evening. , Disp: , Rfl:  .  losartan (COZAAR) 100 MG tablet, Take 100 mg by mouth every evening. , Disp: , Rfl:  .  methocarbamol (ROBAXIN) 500 MG tablet, Take 1 tablet (500 mg total) by mouth 4 (four) times daily., Disp: 45 tablet, Rfl: 0 .  metoprolol succinate (TOPROL XL) 50 MG 24 hr tablet, Take 1 tablet (50 mg total)  by mouth every morning. Hold if systolic blood pressure (top blood pressure number) less than 100 mmHg or heart rate less than 60 bpm (pulse). (Patient taking differently: Take 50 mg by mouth at bedtime. Hold if systolic blood pressure (top blood pressure number) less than 100 mmHg or heart rate less than 60 bpm (pulse).), Disp: 90 tablet, Rfl: 1 .  nitroGLYCERIN (NITROSTAT) 0.4 MG SL tablet, Place 0.4 mg under the tongue every 5 (five) minutes x 3 doses as needed for chest pain. , Disp: , Rfl:  .  psyllium (METAMUCIL) 58.6 % powder, Take 1 packet by mouth daily as needed (Constipation). , Disp: , Rfl:  .  tamsulosin (FLOMAX) 0.4 MG CAPS capsule, Take 0.8 mg by mouth daily after supper. 30 minutes after supper, Disp: , Rfl:  .  amLODipine (NORVASC) 5 MG tablet, Take 1 tablet (5 mg total) by mouth every evening., Disp: 90 tablet, Rfl: 1  IMPRESSION:    ICD-10-CM   1. Microvascular angina (HCC)  I20.8 amLODipine (NORVASC) 5 MG tablet  2. Atherosclerosis of native coronary artery of native heart without angina pectoris  I25.10 CANCELED: EKG 12-Lead  3. Essential hypertension  I10 amLODipine (NORVASC) 5 MG tablet  4. Former smoker  Z87.891   5. Class 3 severe obesity due to excess calories without serious comorbidity with body mass index (BMI) of 40.0 to 44.9 in adult (HCC)  E66.01    Z68.41   6. OSA on CPAP  G47.33    Z99.89      RECOMMENDATIONS: Justin Coleman is a 86 y.o. male whose past medical history and cardiac risk factors include: Coronary artery  disease, microvascular angina, hypertension, sleep apnea, former smoker, advanced age, obesity.  Microvascular angina:  Based on the most recent left heart catheterization patient's underlying angina pectoris appears to be secondary to microvascular angina and/or coronary spasm.  Medications reviewed.   Medications reconciled antianginal therapies include Imdur, Toprol-XL, and amlodipine.    Amlodipine refilled.  Educated on importance of continued smoking cessation.  Most recent lipid profile from 07/2019 reviewed.   Recommend follow-up with primary to screen for diabetes mellitus.    Dyspnea on exertion: Improving  Based on the most recent left heart catheterization his LVEDP was normal.  Continue guideline directed medical therapy for nonobstructive coronary disease.  Educated on increasing physical activity as tolerated with a goal of 30 minutes a day 5 days a week.    Coronary artery disease: Continue current management.  See above  Benign essential hypertension: Improving  Patient blood pressure at today's office visit is acceptable.  Since his morning blood pressures were elevated at last office visit recommended that he undergo sleep study to evaluate for underlying sleep apnea. Patient did follow through with the recommendations and has been diagnosed with sleep apnea and is currently awaiting CPAP machine.  Educated him on the importance of compliance with CPAP machine to help improve his underlying comorbid conditions and sleep patterns.  Low-salt diet recommended.  Obesity, due to excess calories: Body mass index is 41.04 kg/m. . I reviewed with the patient the importance of diet, regular physical activity/exercise, weight loss.   . Patient is educated on increasing physical activity gradually as tolerated.  With the goal of moderate intensity exercise for 30 minutes a day 5 days a week.  Former smoker: Educated on the importance of continued smoking  cessation.  No orders of the defined types were placed in this encounter.  --Continue cardiac medications as reconciled in  final medication list. --Return in about 6 months (around 05/31/2020) for Reevaluation of, Dyspnea. Or sooner if needed. --Continue follow-up with your primary care physician regarding the management of your other chronic comorbid conditions.  Patient's questions and concerns were addressed to his satisfaction. He voices understanding of the instructions provided during this encounter.   This note was created using a voice recognition software as a result there may be grammatical errors inadvertently enclosed that do not reflect the nature of this encounter. Every attempt is made to correct such errors.  Total time spent: 31 minutes. Independently reviewed outside records, medications refilled, disease management discussed, coordination of care, plan of care discussed with the patient and his wife for improvement.  Rex Kras, [place], [facility]  Pager: [phone] Office: [phone]

## 2019-12-01 NOTE — Telephone Encounter (Signed)
Thanks, can you provide me that copy once we have it.

## 2019-12-01 NOTE — Telephone Encounter (Signed)
Received a call from patient's wife. Pt's wife would like to inform you that that pt's PCP is faxing pt's sleep apnea evaluation, recent blood work, and recent diabetes screening.

## 2019-12-07 NOTE — Telephone Encounter (Signed)
We have not received any paperwork from PCP. Called PCP and receptionist stated they would send a message to the Provider to re-send paperwork. Was told we would not receive it any earlier than tomorrow. 12/08/19. Will continue to look for paperwork.

## 2019-12-08 ENCOUNTER — Ambulatory Visit
Admission: RE | Admit: 2019-12-08 | Discharge: 2019-12-08 | Disposition: A | Discharge status: Home / Self Care | Payer: Medicare PPO | Source: Ambulatory Visit | Attending: Nurse Practitioner | Admitting: Nurse Practitioner

## 2019-12-08 ENCOUNTER — Other Ambulatory Visit: Payer: Self-pay | Admitting: Nurse Practitioner

## 2019-12-08 DIAGNOSIS — M79671 Pain in right foot: Secondary | ICD-10-CM

## 2019-12-08 NOTE — Telephone Encounter (Signed)
I was given a copy of the paperwork already.

## 2020-02-09 ENCOUNTER — Other Ambulatory Visit: Payer: Self-pay | Admitting: Cardiology

## 2020-02-09 DIAGNOSIS — I208 Other forms of angina pectoris: Secondary | ICD-10-CM

## 2020-05-31 ENCOUNTER — Encounter: Payer: Self-pay | Admitting: Cardiology

## 2020-05-31 ENCOUNTER — Other Ambulatory Visit: Payer: Self-pay

## 2020-05-31 ENCOUNTER — Ambulatory Visit: Payer: Medicare PPO | Admitting: Cardiology

## 2020-05-31 VITALS — BP 141/76 | HR 62 | Temp 97.9°F | Resp 16 | Ht 70.0 in | Wt 289.0 lb

## 2020-05-31 DIAGNOSIS — I2510 Atherosclerotic heart disease of native coronary artery without angina pectoris: Secondary | ICD-10-CM

## 2020-05-31 DIAGNOSIS — I208 Other forms of angina pectoris: Secondary | ICD-10-CM

## 2020-05-31 DIAGNOSIS — G4733 Obstructive sleep apnea (adult) (pediatric): Secondary | ICD-10-CM

## 2020-05-31 DIAGNOSIS — Z87891 Personal history of nicotine dependence: Secondary | ICD-10-CM

## 2020-05-31 DIAGNOSIS — R0600 Dyspnea, unspecified: Secondary | ICD-10-CM

## 2020-05-31 DIAGNOSIS — Z9989 Dependence on other enabling machines and devices: Secondary | ICD-10-CM

## 2020-05-31 DIAGNOSIS — R0609 Other forms of dyspnea: Secondary | ICD-10-CM

## 2020-05-31 DIAGNOSIS — I10 Essential (primary) hypertension: Secondary | ICD-10-CM

## 2020-05-31 MED ORDER — ATORVASTATIN CALCIUM 10 MG PO TABS: 10.0000 mg | ORAL_TABLET | Freq: Every evening | ORAL | 0 refills | Status: DC

## 2020-05-31 NOTE — Progress Notes (Signed)
[name] Date of Birth: 08/20/1932 MRN: [id number] Primary Care Provider:Elkins, Curt Jews, MD Former Cardiology Providers: Dr. Wynonia Lawman  Primary Cardiologist: Rex Kras, DO, [facility] (established care 06/19/2019)  Date: 05/31/20 Last Office Visit: 12/01/2019  Chief Complaint  Patient presents with  . Follow-up    6 months  Shortness of breath   HPI  Justin Coleman is a 87 y.o. male who presents to the office with a chief complaint of " 38-month follow-up for reevaluation of shortness of breath." Patient's past medical history and cardiac risk factors include: Coronary artery disease, microvascular angina, hypertension, former smoker, sleep apnea, advanced age, obesity.   Patient is accompanied by his wife at today's office visit.  Patient was referred to the office back in April 2021 at the request of his primary care for evaluation of chest pain and shortness of breath.  Given his symptoms and risk factors underwent LHC on 06/27/2019 and was noted have microvascular angina and possible coronary spasm.   Since last office visit he has not had any chest pain or anginal discomfort.  He has not required use of sublingual nitroglycerin tablets.  And his shortness of breath is chronic and stable.  He is overall euvolemic and not in congestive heart failure.  No recent hospitalizations or ER visits for cardiovascular symptoms.  Since last office visit he did start using his CPAP machine but is unable to do so throughout the entire night but tries his best for 4 hours per night.  Currently he is having an upper respiratory tract infection and has been tested negative for COVID-19 infection.  FUNCTIONAL STATUS: Does his best to maintain his vegetable garden (but has been difficult over the last 6 month).    ALLERGIES: Allergies  Allergen Reactions  . Oxycodone Nausea Only    Abdominal pain    MEDICATION LIST PRIOR TO VISIT: Current Outpatient Medications on File Prior to Visit   Medication Sig Dispense Refill  . amLODipine (NORVASC) 5 MG tablet Take 1 tablet (5 mg total) by mouth every evening. 90 tablet 1  . amoxicillin (AMOXIL) 500 MG capsule Take 2,000 mg by mouth See admin instructions. TAKE 4 CAPSULES (2000 MG) BY MOUTH 1 HOUR PRIOR TO DENTAL APPOINTMENTS.    Marland Kitchen aspirin EC 81 MG tablet Take 81 mg by mouth daily.    . hydrochlorothiazide (HYDRODIURIL) 50 MG tablet Take 50 mg by mouth daily.     . isosorbide mononitrate (IMDUR) 30 MG 24 hr tablet Take 15 mg by mouth every evening.     Marland Kitchen losartan (COZAAR) 100 MG tablet Take 100 mg by mouth every evening.     . metoprolol succinate (TOPROL-XL) 50 MG 24 hr tablet TAKE 1 TABLET BY MOUTH EVERY MORNING. HOLD IF SYSTOLIC BLOOD PRESSURE IS LESS THAN 100 MMHG OR HEART RATE IS LESS THAN60 BPM 90 tablet 1  . nitroGLYCERIN (NITROSTAT) 0.4 MG SL tablet Place 0.4 mg under the tongue every 5 (five) minutes x 3 doses as needed for chest pain.     Vladimir Faster Glycol-Propyl Glycol (SYSTANE ULTRA) 0.4-0.3 % SOLN Apply to eye.    . psyllium (METAMUCIL) 58.6 % powder Take 1 packet by mouth daily as needed (Constipation).     . tamsulosin (FLOMAX) 0.4 MG CAPS capsule Take 0.8 mg by mouth daily after supper. 30 minutes after supper     No current facility-administered medications on file prior to visit.    PAST MEDICAL HISTORY: Past Medical History:  Diagnosis Date  .  Arthritis   . BPH (benign prostatic hyperplasia)   . Coronary artery disease    non obstructive  . Dyspnea    with exertion  . GERD (gastroesophageal reflux disease)   . Hypertension   . Lumbago   . Sleep apnea     PAST SURGICAL HISTORY: Past Surgical History:  Procedure Laterality Date  . APPENDECTOMY    . BACK SURGERY  1993  . CARDIAC CATHETERIZATION    . CERVICAL FUSION     age 89   . COLONOSCOPY    . EYE SURGERY Bilateral    cataract  . LEFT HEART CATH AND CORONARY ANGIOGRAPHY N/A 06/27/2019   Procedure: LEFT HEART CATH AND CORONARY ANGIOGRAPHY;   Surgeon: Adrian Prows, MD;  Location: [place] [facility];  Service: Cardiovascular;  Laterality: N/A;  . LUMBAR LAMINECTOMY/DECOMPRESSION MICRODISCECTOMY Left 10/25/2019   Procedure: Left Extraforaminal Lumbar Three-Four Microdiscectomy;  Surgeon: Kary Kos, MD;  Location: [place];  Service: Neurosurgery;  Laterality: Left;  . testicular growth    . TONSILLECTOMY    . TOTAL KNEE ARTHROPLASTY Bilateral 2006/2007    FAMILY HISTORY: The patient family history includes Hypertension in his mother.   SOCIAL HISTORY:  The patient  reports that he quit smoking about 55 years ago. His smoking use included cigarettes. He has a 20.00 pack-year smoking history. He has never used smokeless tobacco. He reports that he does not drink alcohol and does not use drugs.  REVIEW OF SYSTEMS: Review of Systems  Constitutional: Negative for chills and fever.  HENT: Negative for ear discharge, ear pain and nosebleeds.   Eyes: Negative for blurred vision and discharge.  Cardiovascular: Positive for dyspnea on exertion (chronic and stable. ). Negative for chest pain, claudication, leg swelling, near-syncope, orthopnea, palpitations, paroxysmal nocturnal dyspnea and syncope.  Respiratory: Positive for cough (last 1 week (URI symptoms). Negative for shortness of breath.   Endocrine: Negative for polydipsia, polyphagia and polyuria.  Hematologic/Lymphatic: Negative for bleeding problem.  Skin: Negative for flushing and nail changes.  Musculoskeletal: Negative for muscle cramps, muscle weakness and myalgias.  Gastrointestinal: Negative for abdominal pain, dysphagia, hematemesis, hematochezia, melena, nausea and vomiting.  Neurological: Negative for dizziness, focal weakness and light-headedness.   PHYSICAL EXAM: Vitals with BMI 05/31/2020 12/01/2019 10/25/2019  Height $Remov'5\' 10"'KlyHDW$  $RemoveB'5\' 10"'FwTnbuGI$  -  Weight 289 lbs 286 lbs -  BMI 56.31 49.70 -  Systolic 263 785 885  Diastolic 66 59 68  Pulse 62 74 65   CONSTITUTIONAL: Well-developed  and well-nourished. No acute distress.  SKIN: Skin is warm and dry. No rash noted. No cyanosis. No pallor. No jaundice HEAD: Normocephalic and atraumatic.  EYES: No scleral icterus MOUTH/THROAT: Moist oral membranes.  NECK: No JVD present. No thyromegaly noted. No carotid bruits  LYMPHATIC: No visible cervical adenopathy.  CHEST Normal respiratory effort. No intercostal retractions  LUNGS: Clear to auscultation bilaterally.  No stridor. No wheezes. No rales.  CARDIOVASCULAR: Regular positive S1-S2, no murmurs rubs or gallops appreciated ABDOMINAL: Obese, soft, nontender, nondistended, positive bowel sounds in all 4 quadrants, no apparent ascites.  EXTREMITIES: No peripheral edema  HEMATOLOGIC: No significant bruising NEUROLOGIC: Oriented to person, place, and time. Nonfocal. Normal muscle tone.  PSYCHIATRIC: Normal mood and affect. Normal behavior. Cooperative  CARDIAC DATABASE: EKG: 05/31/2020: Sinus  Bradycardia, 59bpm, first degree AV block, consider inferior infarct, without underlying injury pattern.  Echocardiogram: 06/22/2019: LVEF 60-65%, moderate LVH, indeterminate diastolic filling pattern, elevated left atrial pressure, aortic valve sclerosis without stenosis, mild MR, mild TR.  Stress Testing: None  Heart Catheterization: 06/27/19:  Normal LV systolic function, normal EDP. Right coronary artery with a mid 20% stenosis.  Significant improvement in flow with nitroglycerin administration suggestive of mild coronary spasm and microvascular angina. Left main normal. Circumflex moderate size, normal. Ramus intermediate very small, normal. LAD large, smooth and normal, gives origin to a small to moderate-sized D1 which is a ostial 60% stenosis which is 1.5 mm in diameter. Recommendation: Chest pain symptoms could be related to microvascular angina and also possibly coronary spasm.  Patient will be discharged home on amlodipine in addition to the current medical regimen.  45 mL  contrast utilized.  LABORATORY DATA: CBC Latest Ref Rng & Units 10/24/2019 06/27/2019 12/06/2008  WBC 4.0 - 10.5 K/uL 8.0 5.2 5.0  Hemoglobin 13.0 - 17.0 g/dL 14.0 14.1 14.5  Hematocrit 39.0 - 52.0 % 42.9 42.0 42.8  Platelets 150 - 400 K/uL 216 216 184    CMP Latest Ref Rng & Units 10/24/2019 06/19/2019 12/06/2008  Glucose 70 - 99 mg/dL 119(H) 114(H) 98  BUN 8 - 23 mg/dL $Remove'19 21 19  'ZseUCMI$ Creatinine 0.61 - 1.24 mg/dL 0.86 0.84 0.87  Sodium 135 - 145 mmol/L 139 140 140  Potassium 3.5 - 5.1 mmol/L 3.5 4.1 3.8  Chloride 98 - 111 mmol/L 104 101 103  CO2 22 - 32 mmol/L $RemoveB'26 25 31  'vuwcwzzP$ Calcium 8.9 - 10.3 mg/dL 9.3 9.6 9.5  Total Protein 6.0 - 8.5 g/dL - 6.7 -  Total Bilirubin 0.0 - 1.2 mg/dL - 0.3 -  Alkaline Phos 39 - 117 IU/L - 64 -  AST 0 - 40 IU/L - 17 -  ALT 0 - 44 IU/L - 16 -    Lipid Panel     Component Value Date/Time   CHOL 150 07/05/2019 1012   TRIG 141 07/05/2019 1012   HDL 39 (L) 07/05/2019 1012   LDLCALC 86 07/05/2019 1012   LABVLDL 25 07/05/2019 1012    Lab Results  Component Value Date   HGBA1C 6.1 (H) 10/24/2019   No components found for: NTPROBNP No results found for: TSH  External Labs: Collected: 08/11/2019 Creatinine 0.73 mg/dL. eGFR: 85 mL/min per 1.73 m AST 16, ALT 16 alkaline phosphatase 61  FINAL MEDICATION LIST END OF ENCOUNTER: Meds ordered this encounter  Medications  . atorvastatin (LIPITOR) 10 MG tablet    Sig: Take 1 tablet (10 mg total) by mouth at bedtime.    Dispense:  90 tablet    Refill:  0     Current Outpatient Medications:  .  amLODipine (NORVASC) 5 MG tablet, Take 1 tablet (5 mg total) by mouth every evening., Disp: 90 tablet, Rfl: 1 .  amoxicillin (AMOXIL) 500 MG capsule, Take 2,000 mg by mouth See admin instructions. TAKE 4 CAPSULES (2000 MG) BY MOUTH 1 HOUR PRIOR TO DENTAL APPOINTMENTS., Disp: , Rfl:  .  aspirin EC 81 MG tablet, Take 81 mg by mouth daily., Disp: , Rfl:  .  atorvastatin (LIPITOR) 10 MG tablet, Take 1 tablet (10 mg total) by  mouth at bedtime., Disp: 90 tablet, Rfl: 0 .  hydrochlorothiazide (HYDRODIURIL) 50 MG tablet, Take 50 mg by mouth daily. , Disp: , Rfl:  .  isosorbide mononitrate (IMDUR) 30 MG 24 hr tablet, Take 15 mg by mouth every evening. , Disp: , Rfl:  .  losartan (COZAAR) 100 MG tablet, Take 100 mg by mouth every evening. , Disp: , Rfl:  .  metoprolol succinate (TOPROL-XL) 50 MG 24 hr tablet,  TAKE 1 TABLET BY MOUTH EVERY MORNING. HOLD IF SYSTOLIC BLOOD PRESSURE IS LESS THAN 100 MMHG OR HEART RATE IS LESS THAN60 BPM, Disp: 90 tablet, Rfl: 1 .  nitroGLYCERIN (NITROSTAT) 0.4 MG SL tablet, Place 0.4 mg under the tongue every 5 (five) minutes x 3 doses as needed for chest pain. , Disp: , Rfl:  .  Polyethyl Glycol-Propyl Glycol (SYSTANE ULTRA) 0.4-0.3 % SOLN, Apply to eye., Disp: , Rfl:  .  psyllium (METAMUCIL) 58.6 % powder, Take 1 packet by mouth daily as needed (Constipation). , Disp: , Rfl:  .  tamsulosin (FLOMAX) 0.4 MG CAPS capsule, Take 0.8 mg by mouth daily after supper. 30 minutes after supper, Disp: , Rfl:   IMPRESSION:    ICD-10-CM   1. Dyspnea on exertion  R06.00 EKG 12-Lead  2. Microvascular angina (HCC)  I20.8 atorvastatin (LIPITOR) 10 MG tablet  3. Atherosclerosis of native coronary artery of native heart without angina pectoris  I25.10 atorvastatin (LIPITOR) 10 MG tablet    Lipid Panel With LDL/HDL Ratio    CMP14+EGFR    LDL cholesterol, direct    LDL cholesterol, direct    CMP14+EGFR    Lipid Panel With LDL/HDL Ratio  4. Essential hypertension  I10   5. Former smoker  Z87.891   23. Class 3 severe obesity due to excess calories without serious comorbidity with body mass index (BMI) of 40.0 to 44.9 in adult (HCC)  E66.01    Z68.41   7. OSA on CPAP  G47.33    Z99.89      RECOMMENDATIONS: Justin Coleman is a 87 y.o. male whose past medical history and cardiac risk factors include: Coronary artery disease, microvascular angina, hypertension, sleep apnea, former smoker, advanced age,  obesity.  Microvascular angina:  Noted on the most recent left heart catheterization.  Patient has been chest pain-free since last office visit and has not required the use of sublingual nitroglycerin tablets.    Medications reconciled.  Educated on the importance of continued smoking cessation.  Given his underlying nonobstructive CAD recommend initiation of low intensity statin.   Start atorvastatin 10 mg p.o. nightly.  We will repeat blood work in 6 weeks to evaluate liver function and fasting lipid profile.    Educated on the importance of increasing physical activity as tolerated for goal of 30 minutes a day 5 days a week.  Patient has gained approximately 3 pounds in the last office visit.    Dyspnea on exertion: Chronic and stable  Based on the most recent left heart catheterization his LVEDP was normal.  Continue guideline directed medical therapy for nonobstructive coronary disease.  Educated on increasing physical activity as tolerated with a goal of 30 minutes a day 5 days a week.    Coronary artery disease: Continue current management.  See above  Benign essential hypertension: Improving  Blood pressure within acceptable range; however currently not at goal.  Most likely secondary to his underlying URI.    Medications reconciled.    Low-salt diet recommended.    Encouraged him to keep a log of his blood pressures and to review it with his primary care provider to see if additional medication titration is warranted.   Obesity, due to excess calories: Body mass index is 41.47 kg/m. . I reviewed with the patient the importance of diet, regular physical activity/exercise, weight loss.   . Patient is educated on increasing physical activity gradually as tolerated.  With the goal of moderate intensity exercise for 30 minutes  a day 5 days a week.  Former smoker: Educated on the importance of continued smoking cessation.  Orders Placed This Encounter  Procedures  .  Lipid Panel With LDL/HDL Ratio  . CMP14+EGFR  . LDL cholesterol, direct  . EKG 12-Lead   --Continue cardiac medications as reconciled in final medication list. --Return in about 6 months (around 11/30/2020) for Follow up, Dyspnea, CAD. Or sooner if needed. --Continue follow-up with your primary care physician regarding the management of your other chronic comorbid conditions.  Patient's questions and concerns were addressed to his satisfaction. He voices understanding of the instructions provided during this encounter.   This note was created using a voice recognition software as a result there may be grammatical errors inadvertently enclosed that do not reflect the nature of this encounter. Every attempt is made to correct such errors.  Total time spent: 31 minutes. Independently reviewed outside records, medications refilled, disease management discussed, coordination of care, plan of care discussed with the patient and his wife for improvement.  Rex Kras, [place], [facility]  Pager: [phone] Office: [phone]

## 2020-06-12 ENCOUNTER — Other Ambulatory Visit: Payer: Self-pay | Admitting: Cardiology

## 2020-06-12 DIAGNOSIS — I10 Essential (primary) hypertension: Secondary | ICD-10-CM

## 2020-06-12 DIAGNOSIS — I208 Other forms of angina pectoris: Secondary | ICD-10-CM

## 2020-07-16 LAB — CMP14+EGFR
ALT: 15 IU/L (ref 0–44)
AST: 17 IU/L (ref 0–40)
Albumin/Globulin Ratio: 1.8 (ref 1.2–2.2)
Albumin: 4.1 g/dL (ref 3.6–4.6)
Alkaline Phosphatase: 61 IU/L (ref 44–121)
BUN/Creatinine Ratio: 25 — ABNORMAL HIGH (ref 10–24)
BUN: 20 mg/dL (ref 8–27)
Bilirubin Total: 0.4 mg/dL (ref 0.0–1.2)
CO2: 23 mmol/L (ref 20–29)
Calcium: 9.2 mg/dL (ref 8.6–10.2)
Chloride: 101 mmol/L (ref 96–106)
Creatinine, Ser: 0.8 mg/dL (ref 0.76–1.27)
Globulin, Total: 2.3 g/dL (ref 1.5–4.5)
Glucose: 102 mg/dL — ABNORMAL HIGH (ref 65–99)
Potassium: 4.2 mmol/L (ref 3.5–5.2)
Sodium: 138 mmol/L (ref 134–144)
Total Protein: 6.4 g/dL (ref 6.0–8.5)
eGFR: 87 mL/min/{1.73_m2} (ref >59)

## 2020-07-16 LAB — LIPID PANEL WITH LDL/HDL RATIO
Cholesterol, Total: 104 mg/dL (ref 100–199)
HDL: 40 mg/dL (ref >39)
LDL Chol Calc (NIH): 45 mg/dL (ref 0–99)
LDL/HDL Ratio: 1.1 ratio (ref 0.0–3.6)
Triglycerides: 101 mg/dL (ref 0–149)
VLDL Cholesterol Cal: 19 mg/dL (ref 5–40)

## 2020-07-16 LAB — LDL CHOLESTEROL, DIRECT: LDL Direct: 44 mg/dL (ref 0–99)

## 2020-07-17 NOTE — Progress Notes (Signed)
2nd attempt : Phone disconnected or not currently working.

## 2020-07-17 NOTE — Progress Notes (Signed)
Called pt no answer, left a vm

## 2020-07-18 NOTE — Progress Notes (Signed)
3rd attempt : Called patient, Na, LMAM

## 2020-08-06 ENCOUNTER — Other Ambulatory Visit: Payer: Self-pay | Admitting: Cardiology

## 2020-08-06 DIAGNOSIS — I208 Other forms of angina pectoris: Secondary | ICD-10-CM

## 2020-08-23 ENCOUNTER — Other Ambulatory Visit: Payer: Self-pay | Admitting: Cardiology

## 2020-08-23 DIAGNOSIS — I208 Other forms of angina pectoris: Secondary | ICD-10-CM

## 2020-08-23 DIAGNOSIS — I2510 Atherosclerotic heart disease of native coronary artery without angina pectoris: Secondary | ICD-10-CM

## 2020-11-21 ENCOUNTER — Other Ambulatory Visit: Payer: Self-pay | Admitting: Cardiology

## 2020-11-21 DIAGNOSIS — I2510 Atherosclerotic heart disease of native coronary artery without angina pectoris: Secondary | ICD-10-CM

## 2020-11-21 DIAGNOSIS — I208 Other forms of angina pectoris: Secondary | ICD-10-CM

## 2020-12-02 ENCOUNTER — Ambulatory Visit: Payer: Medicare PPO | Admitting: Cardiology

## 2020-12-02 ENCOUNTER — Encounter: Payer: Self-pay | Admitting: Cardiology

## 2020-12-02 ENCOUNTER — Other Ambulatory Visit: Payer: Self-pay

## 2020-12-02 VITALS — BP 131/63 | HR 62 | Temp 97.6°F | Resp 16 | Ht 70.0 in | Wt 285.0 lb

## 2020-12-02 DIAGNOSIS — I208 Other forms of angina pectoris: Secondary | ICD-10-CM

## 2020-12-02 DIAGNOSIS — R0609 Other forms of dyspnea: Secondary | ICD-10-CM

## 2020-12-02 DIAGNOSIS — G4733 Obstructive sleep apnea (adult) (pediatric): Secondary | ICD-10-CM

## 2020-12-02 DIAGNOSIS — I10 Essential (primary) hypertension: Secondary | ICD-10-CM

## 2020-12-02 DIAGNOSIS — Z87891 Personal history of nicotine dependence: Secondary | ICD-10-CM

## 2020-12-02 DIAGNOSIS — Z6841 Body Mass Index (BMI) 40.0 and over, adult: Secondary | ICD-10-CM

## 2020-12-02 DIAGNOSIS — I2510 Atherosclerotic heart disease of native coronary artery without angina pectoris: Secondary | ICD-10-CM

## 2020-12-02 NOTE — Progress Notes (Signed)
Justin Coleman Date of Birth: 1932/12/02 MRN: [id number] Primary Care Provider:Elkins, Curt Jews, MD Former Cardiology Providers: Dr. Wynonia Lawman  Primary Cardiologist: Rex Kras, DO, [facility] (established care 06/19/2019)  Date: 12/02/20 Last Office Visit: 05/31/2020  Chief Complaint  Patient presents with   Dyspnea on exertion   Coronary Artery Disease   Follow-up   HPI  Justin Coleman is a 87 y.o. male who presents to the office with a chief complaint of " 6 month follow up for angina and shortness of breath." Patient's past medical history and cardiac risk factors include: Coronary artery disease, microvascular angina, hypertension, former smoker, sleep apnea, advanced age, obesity.   Patient is accompanied by his wife at today's office visit.  Patient was referred to the office back in April 2021 at the request of his primary care for evaluation of chest pain and shortness of breath.  Given his symptoms and risk factors underwent LHC on 06/27/2019 and was noted have microvascular angina and possible coronary spasm.   Since last office visit patient doing well from a cardiovascular standpoint.  He denies any chest pain at rest or with effort related activities.  No use of sublingual nitroglycerin tablets.  Remains euvolemic on physical examination.  His shortness of breath with effort related activities remains chronic and stable.  He has lost 4 pounds due to lifestyle changes since the last office visit for which he is congratulated for during the visit.  He is compliant with the CPAP machine approximately 45 hours on a daily basis.  ALLERGIES: Allergies  Allergen Reactions   Oxycodone Nausea Only    Abdominal pain    MEDICATION LIST PRIOR TO VISIT: Current Outpatient Medications on File Prior to Visit  Medication Sig Dispense Refill   amLODipine (NORVASC) 5 MG tablet TAKE 1 TABLET BY MOUTH EVERY EVENING 90 tablet 1   amoxicillin (AMOXIL) 500 MG capsule Take 2,000 mg by  mouth See admin instructions. TAKE 4 CAPSULES (2000 MG) BY MOUTH 1 HOUR PRIOR TO DENTAL APPOINTMENTS.     aspirin EC 81 MG tablet Take 81 mg by mouth daily.     atorvastatin (LIPITOR) 10 MG tablet TAKE 1 TABLET BY MOUTH EACH NIGHT AT BEDTIME 90 tablet 0   hydrochlorothiazide (HYDRODIURIL) 50 MG tablet Take 50 mg by mouth daily.      isosorbide mononitrate (IMDUR) 30 MG 24 hr tablet Take 15 mg by mouth every evening.      losartan (COZAAR) 100 MG tablet Take 100 mg by mouth every evening.      metoprolol succinate (TOPROL-XL) 50 MG 24 hr tablet TAKE 1 TABLET BY MOUTH EVERY MORNING. HOLD IF SYSTOLIC BLOOD PRESSURE IS LESS THAN 100 MMHG OR HEART RATE IS LESS THAN60 BPM 90 tablet 1   nitroGLYCERIN (NITROSTAT) 0.4 MG SL tablet Place 0.4 mg under the tongue every 5 (five) minutes x 3 doses as needed for chest pain.      Polyethyl Glycol-Propyl Glycol (SYSTANE ULTRA) 0.4-0.3 % SOLN Apply to eye.     psyllium (METAMUCIL) 58.6 % powder Take 1 packet by mouth daily as needed (Constipation).      tamsulosin (FLOMAX) 0.4 MG CAPS capsule Take 0.8 mg by mouth daily after supper. 30 minutes after supper     No current facility-administered medications on file prior to visit.    PAST MEDICAL HISTORY: Past Medical History:  Diagnosis Date   Arthritis    BPH (benign prostatic hyperplasia)    Coronary artery disease  non obstructive   Dyspnea    with exertion   GERD (gastroesophageal reflux disease)    Hypertension    Lumbago    Sleep apnea     PAST SURGICAL HISTORY: Past Surgical History:  Procedure Laterality Date   APPENDECTOMY     BACK SURGERY  1993   CARDIAC CATHETERIZATION     CERVICAL FUSION     age 55    COLONOSCOPY     EYE SURGERY Bilateral    cataract   LEFT HEART CATH AND CORONARY ANGIOGRAPHY N/A 06/27/2019   Procedure: LEFT HEART CATH AND CORONARY ANGIOGRAPHY;  Surgeon: Adrian Prows, MD;  Location: [place] [facility];  Service: Cardiovascular;  Laterality: N/A;   LUMBAR  LAMINECTOMY/DECOMPRESSION MICRODISCECTOMY Left 10/25/2019   Procedure: Left Extraforaminal Lumbar Three-Four Microdiscectomy;  Surgeon: Kary Kos, MD;  Location: [place];  Service: Neurosurgery;  Laterality: Left;   testicular growth     TONSILLECTOMY     TOTAL KNEE ARTHROPLASTY Bilateral 2006/2007    FAMILY HISTORY: The patient family history includes Hypertension in his mother.   SOCIAL HISTORY:  The patient  reports that he quit smoking about 55 years ago. His smoking use included cigarettes. He has a 20.00 pack-year smoking history. He has never used smokeless tobacco. He reports that he does not drink alcohol and does not use drugs.  REVIEW OF SYSTEMS: Review of Systems  Constitutional: Negative for chills and fever.  HENT:  Negative for ear discharge, ear pain and nosebleeds.   Eyes:  Negative for blurred vision and discharge.  Cardiovascular:  Positive for dyspnea on exertion (chronic and stable. ). Negative for chest pain, claudication, leg swelling, near-syncope, orthopnea, palpitations, paroxysmal nocturnal dyspnea and syncope.  Respiratory:  Negative for cough and shortness of breath.   Endocrine: Negative for polydipsia, polyphagia and polyuria.  Hematologic/Lymphatic: Negative for bleeding problem.  Skin:  Negative for flushing and nail changes.  Musculoskeletal:  Negative for muscle cramps, muscle weakness and myalgias.  Gastrointestinal:  Negative for abdominal pain, dysphagia, hematemesis, hematochezia, melena, nausea and vomiting.  Neurological:  Negative for dizziness, focal weakness and light-headedness.   PHYSICAL EXAM: Vitals with BMI 12/02/2020 05/31/2020 12/01/2019  Height $Remov'5\' 10"'qfdnkk$  $RemoveB'5\' 10"'iweKxXVg$  $RemoveBef'5\' 10"'LRkhCexLRt$   Weight 285 lbs 289 lbs 286 lbs  BMI 40.89 88.50 27.74  Systolic 128 786 767  Diastolic 63 76 59  Pulse 62 62 74   CONSTITUTIONAL: Well-developed and well-nourished. No acute distress.  SKIN: Skin is warm and dry. No rash noted. No cyanosis. No pallor. No jaundice HEAD:  Normocephalic and atraumatic.  EYES: No scleral icterus MOUTH/THROAT: Moist oral membranes.  NECK: No JVD present. No thyromegaly noted. No carotid bruits  LYMPHATIC: No visible cervical adenopathy.  CHEST Normal respiratory effort. No intercostal retractions  LUNGS: Clear to auscultation bilaterally.  No stridor. No wheezes. No rales.  CARDIOVASCULAR: Regular positive S1-S2, no murmurs rubs or gallops appreciated ABDOMINAL: Obese, soft, nontender, nondistended, positive bowel sounds in all 4 quadrants, no apparent ascites.  EXTREMITIES: No peripheral edema  HEMATOLOGIC: No significant bruising NEUROLOGIC: Oriented to person, place, and time. Nonfocal. Normal muscle tone.  PSYCHIATRIC: Normal mood and affect. Normal behavior. Cooperative  CARDIAC DATABASE: EKG: 12/02/2020: Sinus bradycardia, 56 bpm, occasional PACs, without underlying ischemia or injury pattern.  Echocardiogram: 06/22/2019: LVEF 60-65%, moderate LVH, indeterminate diastolic filling pattern, elevated left atrial pressure, aortic valve sclerosis without stenosis, mild MR, mild TR.  Stress Testing: None  Heart Catheterization: 06/27/19:  Normal LV systolic function, normal EDP. Right coronary  artery with a mid 20% stenosis.  Significant improvement in flow with nitroglycerin administration suggestive of mild coronary spasm and microvascular angina. Left main normal. Circumflex moderate size, normal. Ramus intermediate very small, normal. LAD large, smooth and normal, gives origin to a small to moderate-sized D1 which is a ostial 60% stenosis which is 1.5 mm in diameter.  Recommendation: Chest pain symptoms could be related to microvascular angina and also possibly coronary spasm.  Patient will be discharged home on amlodipine in addition to the current medical regimen.  45 mL contrast utilized.  LABORATORY DATA: CBC Latest Ref Rng & Units 10/24/2019 06/27/2019 12/06/2008  WBC 4.0 - 10.5 K/uL 8.0 5.2 5.0  Hemoglobin 13.0  - 17.0 g/dL 14.0 14.1 14.5  Hematocrit 39.0 - 52.0 % 42.9 42.0 42.8  Platelets 150 - 400 K/uL 216 216 184    CMP Latest Ref Rng & Units 07/15/2020 10/24/2019 06/19/2019  Glucose 65 - 99 mg/dL 102(H) 119(H) 114(H)  BUN 8 - 27 mg/dL $Remove'20 19 21  'GKOmpuE$ Creatinine 0.76 - 1.27 mg/dL 0.80 0.86 0.84  Sodium 134 - 144 mmol/L 138 139 140  Potassium 3.5 - 5.2 mmol/L 4.2 3.5 4.1  Chloride 96 - 106 mmol/L 101 104 101  CO2 20 - 29 mmol/L $RemoveB'23 26 25  'qbfMbOUe$ Calcium 8.6 - 10.2 mg/dL 9.2 9.3 9.6  Total Protein 6.0 - 8.5 g/dL 6.4 - 6.7  Total Bilirubin 0.0 - 1.2 mg/dL 0.4 - 0.3  Alkaline Phos 44 - 121 IU/L 61 - 64  AST 0 - 40 IU/L 17 - 17  ALT 0 - 44 IU/L 15 - 16    Lipid Panel     Component Value Date/Time   CHOL 104 07/15/2020 0954   TRIG 101 07/15/2020 0954   HDL 40 07/15/2020 0954   LDLCALC 45 07/15/2020 0954   LDLDIRECT 44 07/15/2020 0954   LABVLDL 19 07/15/2020 0954    Lab Results  Component Value Date   HGBA1C 6.1 (H) 10/24/2019   No components found for: NTPROBNP No results found for: TSH  External Labs: Collected: 08/11/2019 Creatinine 0.73 mg/dL. eGFR: 85 mL/min per 1.73 m AST 16, ALT 16 alkaline phosphatase 61  FINAL MEDICATION LIST END OF ENCOUNTER: No orders of the defined types were placed in this encounter.    Current Outpatient Medications:    amLODipine (NORVASC) 5 MG tablet, TAKE 1 TABLET BY MOUTH EVERY EVENING, Disp: 90 tablet, Rfl: 1   amoxicillin (AMOXIL) 500 MG capsule, Take 2,000 mg by mouth See admin instructions. TAKE 4 CAPSULES (2000 MG) BY MOUTH 1 HOUR PRIOR TO DENTAL APPOINTMENTS., Disp: , Rfl:    aspirin EC 81 MG tablet, Take 81 mg by mouth daily., Disp: , Rfl:    atorvastatin (LIPITOR) 10 MG tablet, TAKE 1 TABLET BY MOUTH EACH NIGHT AT BEDTIME, Disp: 90 tablet, Rfl: 0   hydrochlorothiazide (HYDRODIURIL) 50 MG tablet, Take 50 mg by mouth daily. , Disp: , Rfl:    isosorbide mononitrate (IMDUR) 30 MG 24 hr tablet, Take 15 mg by mouth every evening. , Disp: , Rfl:     losartan (COZAAR) 100 MG tablet, Take 100 mg by mouth every evening. , Disp: , Rfl:    metoprolol succinate (TOPROL-XL) 50 MG 24 hr tablet, TAKE 1 TABLET BY MOUTH EVERY MORNING. HOLD IF SYSTOLIC BLOOD PRESSURE IS LESS THAN 100 MMHG OR HEART RATE IS LESS THAN60 BPM, Disp: 90 tablet, Rfl: 1   nitroGLYCERIN (NITROSTAT) 0.4 MG SL tablet, Place 0.4 mg under the tongue every  5 (five) minutes x 3 doses as needed for chest pain. , Disp: , Rfl:    Polyethyl Glycol-Propyl Glycol (SYSTANE ULTRA) 0.4-0.3 % SOLN, Apply to eye., Disp: , Rfl:    psyllium (METAMUCIL) 58.6 % powder, Take 1 packet by mouth daily as needed (Constipation). , Disp: , Rfl:    tamsulosin (FLOMAX) 0.4 MG CAPS capsule, Take 0.8 mg by mouth daily after supper. 30 minutes after supper, Disp: , Rfl:   IMPRESSION:    ICD-10-CM   1. Microvascular angina (HCC)  I20.8 EKG 12-Lead    2. Atherosclerosis of native coronary artery of native heart without angina pectoris  I25.10 EKG 12-Lead    3. Dyspnea on exertion  R06.09     4. OSA on CPAP  G47.33    Z99.89     5. Essential hypertension  I10     6. Former smoker  Z87.891     15. Class 3 severe obesity due to excess calories without serious comorbidity with body mass index (BMI) of 40.0 to 44.9 in adult [facility])  E66.01    Z68.41        RECOMMENDATIONS: NEERAJ HOUSAND is a 87 y.o. male whose past medical history and cardiac risk factors include: Coronary artery disease, microvascular angina, hypertension, sleep apnea, former smoker, advanced age, obesity.  Microvascular angina: Chest pain-free. No use of sublingual nitroglycerin tablets since last office encounter. Medications reconciled. Educated on the importance of secondary prevention. Has tolerated the initiation of Lipitor 10 mg p.o. nightly.  With follow-up blood work in 6-week notes stable liver function and improvement in lipid profile. Implementing lifestyle changes to facilitate weight loss.  He has lost 4 pounds over the  course of 6 months. No hospitalizations or urgent care visits for cardiovascular symptoms since last encounter  Dyspnea on exertion:  Chronic and stable Overall euvolemic, LVEDP recent heart catheterization was normal. Monitor for now.  Coronary artery disease: Continue current management.  See above  Benign essential hypertension:  Improving Office blood pressures are very well controlled. Medications reconciled. Low-salt diet recommended. Congratulated with regards to his weight loss since last office encounter. Educated in the importance of improving his modifiable cardiovascular risk factors and increasing physical activity as tolerated with a goal of 30 minutes a day 5 days a week.  Former smoker: Educated on the importance of continued smoking cessation.  Lipid profile and labs from 07/15/2020 independently reviewed and noted above for further reference.  Orders Placed This Encounter  Procedures   EKG 12-Lead   --Continue cardiac medications as reconciled in final medication list. --Return in about 6 months (around 06/02/2021) for Follow up, CAD, Dyspnea. Or sooner if needed. --Continue follow-up with your primary care physician regarding the management of your other chronic comorbid conditions.  Patient's questions and concerns were addressed to his satisfaction. He voices understanding of the instructions provided during this encounter.   This note was created using a voice recognition software as a result there may be grammatical errors inadvertently enclosed that do not reflect the nature of this encounter. Every attempt is made to correct such errors.   Rex Kras, [place], [facility]  Pager: [phone] Office: [phone]

## 2020-12-09 ENCOUNTER — Other Ambulatory Visit: Payer: Self-pay | Admitting: Cardiology

## 2020-12-09 DIAGNOSIS — I208 Other forms of angina pectoris: Secondary | ICD-10-CM

## 2020-12-09 DIAGNOSIS — I10 Essential (primary) hypertension: Secondary | ICD-10-CM

## 2021-01-31 ENCOUNTER — Other Ambulatory Visit: Payer: Self-pay | Admitting: Cardiology

## 2021-01-31 DIAGNOSIS — I208 Other forms of angina pectoris: Secondary | ICD-10-CM

## 2021-02-19 ENCOUNTER — Other Ambulatory Visit: Payer: Self-pay | Admitting: Cardiology

## 2021-02-19 DIAGNOSIS — I208 Other forms of angina pectoris: Secondary | ICD-10-CM

## 2021-02-19 DIAGNOSIS — I2510 Atherosclerotic heart disease of native coronary artery without angina pectoris: Secondary | ICD-10-CM

## 2021-05-07 ENCOUNTER — Other Ambulatory Visit: Payer: Self-pay | Admitting: Neurosurgery

## 2021-05-07 DIAGNOSIS — M5440 Lumbago with sciatica, unspecified side: Secondary | ICD-10-CM

## 2021-05-28 ENCOUNTER — Ambulatory Visit
Admission: RE | Admit: 2021-05-28 | Discharge: 2021-05-28 | Disposition: A | Discharge status: Home / Self Care | Payer: Medicare PPO | Source: Ambulatory Visit | Attending: Neurosurgery | Admitting: Neurosurgery

## 2021-05-28 ENCOUNTER — Other Ambulatory Visit: Payer: Self-pay

## 2021-05-28 DIAGNOSIS — M5440 Lumbago with sciatica, unspecified side: Secondary | ICD-10-CM

## 2021-05-28 MED ORDER — GADOBENATE DIMEGLUMINE 529 MG/ML IV SOLN
20.0000 mL | Freq: Once | INTRAVENOUS | Status: AC | PRN
  Administered 2021-05-28: 20 mL via INTRAVENOUS

## 2021-05-30 ENCOUNTER — Other Ambulatory Visit: Payer: Medicare PPO

## 2021-06-04 ENCOUNTER — Ambulatory Visit: Payer: Medicare PPO | Admitting: Cardiology

## 2021-06-05 ENCOUNTER — Other Ambulatory Visit: Payer: Self-pay | Admitting: Cardiology

## 2021-06-05 DIAGNOSIS — I208 Other forms of angina pectoris: Secondary | ICD-10-CM

## 2021-06-05 DIAGNOSIS — I10 Essential (primary) hypertension: Secondary | ICD-10-CM

## 2021-08-05 ENCOUNTER — Encounter: Payer: Self-pay | Admitting: Cardiology

## 2021-08-05 ENCOUNTER — Ambulatory Visit: Payer: Medicare PPO | Admitting: Cardiology

## 2021-08-05 VITALS — BP 133/61 | HR 69 | Temp 98.1°F | Resp 17 | Ht 70.0 in | Wt 294.6 lb

## 2021-08-05 DIAGNOSIS — I208 Other forms of angina pectoris: Secondary | ICD-10-CM

## 2021-08-05 DIAGNOSIS — Z01810 Encounter for preprocedural cardiovascular examination: Secondary | ICD-10-CM

## 2021-08-05 DIAGNOSIS — R739 Hyperglycemia, unspecified: Secondary | ICD-10-CM

## 2021-08-05 DIAGNOSIS — I10 Essential (primary) hypertension: Secondary | ICD-10-CM

## 2021-08-05 MED ORDER — SPIRONOLACTONE 25 MG PO TABS: 25.0000 mg | ORAL_TABLET | ORAL | 2 refills | Status: AC

## 2021-08-05 MED ORDER — METOPROLOL SUCCINATE ER 50 MG PO TB24: ORAL_TABLET | ORAL | 0 refills | Status: AC

## 2021-08-05 NOTE — Progress Notes (Signed)
Justin Coleman Date of Birth: 1932-07-07 MRN: [id number] Primary Care Provider:Elkins, Curt Jews, MD Former Cardiology Providers: Dr. Wynonia Lawman  Primary Cardiologist: Adrian Prows, MD, [facility] (established care 06/19/2019)  Date: 08/05/21 Last Office Visit: 05/31/2020  Chief Complaint  Patient presents with   Follow-up    6 month   Coronary Artery Disease   Shortness of Breath   HPI  Justin Coleman is a 88 y.o. male whose past medical history and cardiac risk factors include: microvascular angina, hypertension, sleep apnea, former smoker, advanced age, morbid obesity.  He is now scheduled for back surgery by Dr. Kary Kos.  His cardiac catheterization on 06/27/2019 revealed no significant coronary artery disease but slow filling suggestive of microvascular disease.  Since being on aggressive medical therapy, he has not had any recurrence of angina pectoris and he remains asymptomatic.    He is compliant with the CPAP machine.  He is here for preoperative cardiovascular evaluation as he is scheduled for back surgery.  ALLERGIES: Allergies  Allergen Reactions   Oxycodone Nausea Only    Abdominal pain    MEDICATION LIST PRIOR TO VISIT:  PAST MEDICAL HISTORY: Past Medical History:  Diagnosis Date   Arthritis    BPH (benign prostatic hyperplasia)    Coronary artery disease    non obstructive   Dyspnea    with exertion   GERD (gastroesophageal reflux disease)    Hypertension    Lumbago    Sleep apnea    REVIEW OF SYSTEMS: Review of Systems  Cardiovascular:  Positive for dyspnea on exertion and leg swelling. Negative for chest pain.  Musculoskeletal:  Positive for back pain.   PHYSICAL EXAM:    08/05/2021    1:27 PM 08/05/2021    1:16 PM 12/02/2020   10:38 AM  Vitals with BMI  Height  5\' 10"  5\' 10"   Weight  294 lbs 10 oz 285 lbs  BMI  99991111 [id number]  Systolic [id number]  Diastolic 61 63 63  Pulse 69 66 62   Physical Exam Neck:     Vascular: No JVD.  Cardiovascular:      Rate and Rhythm: Normal rate and regular rhythm.     Pulses: Intact distal pulses.          Dorsalis pedis pulses are 1+ on the right side and 1+ on the left side.       Posterior tibial pulses are 1+ on the right side and 1+ on the left side.     Heart sounds: Normal heart sounds. No murmur heard.   No gallop.  Pulmonary:     Effort: Pulmonary effort is normal.     Breath sounds: Normal breath sounds.  Abdominal:     General: Abdomen is protuberant. Bowel sounds are normal.     Palpations: Abdomen is soft.  Musculoskeletal:     Right lower leg: Edema (2+ below knee) present.     Left lower leg: Edema (2+ below knee) present.   CARDIAC DATABASE:  EKG:  EKG 08/05/2021: Sinus rhythm with first-degree block at rate of 68 bpm, left atrial abnormality, normal axis.  Incomplete right bundle branch block.  Poor R wave progression, cannot exclude anterolateral infarct old.  Compared to 12/02/2020, poor R wave progression is new.   Echocardiogram: 06/22/2019: LVEF 60-65%, moderate LVH, indeterminate diastolic filling pattern, elevated left atrial pressure, aortic valve sclerosis without stenosis, mild MR, mild TR.  Stress Testing: None  Heart Catheterization: 06/27/19:  Normal LV systolic function,  normal EDP. Right coronary artery with a mid 20% stenosis.  Significant improvement in flow with nitroglycerin administration suggestive of mild coronary spasm and microvascular angina. Left main normal. Circumflex moderate size, normal. Ramus intermediate very small, normal. LAD large, smooth and normal, gives origin to a small to moderate-sized D1 which is a ostial 60% stenosis which is 1.5 mm in diameter.  Recommendation: Chest pain symptoms could be related to microvascular angina and also possibly coronary spasm.  Patient will be discharged home on amlodipine in addition to the current medical regimen.  45 mL contrast utilized.  LABORATORY DATA:  External Labs:  Cholesterol, total  104.000 m 07/15/2020 HDL 40.000 mg 07/15/2020 LDL 44.000 MG 07/15/2020 Triglycerides 101.000 m 07/15/2020  Hemoglobin 14.000 g/d 10/24/2019  Creatinine, Serum 0.800 mg/ 07/15/2020  Potassium 4.200 mm 07/15/2020 ALT (SGPT) 15.000 IU/ 07/15/2020   Collected: 08/11/2019  A1C 6.100 % 10/24/2019  Creatinine 0.73 mg/dL. eGFR: 85 mL/min per 1.73 m AST 16, ALT 16 alkaline phosphatase 61  FINAL MEDICATION LIST END OF ENCOUNTER:   Current Outpatient Medications:    amLODipine (NORVASC) 2.5 MG tablet, Take 2.5 mg by mouth daily., Disp: , Rfl:    amoxicillin (AMOXIL) 500 MG capsule, Take 2,000 mg by mouth See admin instructions. TAKE 4 CAPSULES (2000 MG) BY MOUTH 1 HOUR PRIOR TO DENTAL APPOINTMENTS., Disp: , Rfl:    aspirin EC 81 MG tablet, Take 81 mg by mouth daily., Disp: , Rfl:    atorvastatin (LIPITOR) 10 MG tablet, TAKE 1 TABLET BY MOUTH EACH NIGHT AT BEDTIME, Disp: 90 tablet, Rfl: 1   hydrochlorothiazide (HYDRODIURIL) 50 MG tablet, Take 50 mg by mouth daily. , Disp: , Rfl:    isosorbide mononitrate (IMDUR) 30 MG 24 hr tablet, Take 15 mg by mouth every evening. , Disp: , Rfl:    losartan (COZAAR) 100 MG tablet, Take 100 mg by mouth every evening. , Disp: , Rfl:    nitroGLYCERIN (NITROSTAT) 0.4 MG SL tablet, Place 0.4 mg under the tongue every 5 (five) minutes x 3 doses as needed for chest pain. , Disp: , Rfl:    Polyethyl Glycol-Propyl Glycol (SYSTANE ULTRA) 0.4-0.3 % SOLN, Apply to eye., Disp: , Rfl:    psyllium (METAMUCIL) 58.6 % powder, Take 1 packet by mouth daily as needed (Constipation). , Disp: , Rfl:    spironolactone (ALDACTONE) 25 MG tablet, Take 1 tablet (25 mg total) by mouth every morning., Disp: 30 tablet, Rfl: 2   tamsulosin (FLOMAX) 0.4 MG CAPS capsule, Take 0.8 mg by mouth daily after supper. 30 minutes after supper, Disp: , Rfl:    metoprolol succinate (TOPROL-XL) 50 MG 24 hr tablet, TAKE 1 TABLET BY MOUTH EVERY MORNING. HOLD IF SYSTOLIC BLOOD PRESSURE IS LESS THAN 100 MMHG OR  HEART RATE IS LESS THAN60 BPM, Disp: 90 tablet, Rfl: 0  IMPRESSION:    ICD-10-CM   1. Pre-operative cardiovascular examination  Z01.810     2. Microvascular angina (HCC)  I20.8 metoprolol succinate (TOPROL-XL) 50 MG 24 hr tablet    3. Essential hypertension  I10 EKG 12-Lead    spironolactone (ALDACTONE) 25 MG tablet    Basic metabolic panel    TSH    4. Class 3 severe obesity due to excess calories without serious comorbidity with body mass index (BMI) of 40.0 to 44.9 in adult ([place])  E66.01    Z68.41     5. Hyperglycemia  R73.9 Hgb A1c w/o eAG      Orders Placed This Encounter  Procedures   Basic metabolic panel   Hgb M7A w/o eAG   TSH   EKG 12-Lead   Meds ordered this encounter  Medications   spironolactone (ALDACTONE) 25 MG tablet    Sig: Take 1 tablet (25 mg total) by mouth every morning.    Dispense:  30 tablet    Refill:  2   metoprolol succinate (TOPROL-XL) 50 MG 24 hr tablet    Sig: TAKE 1 TABLET BY MOUTH EVERY MORNING. HOLD IF SYSTOLIC BLOOD PRESSURE IS LESS THAN 100 MMHG OR HEART RATE IS LESS THAN60 BPM    Dispense:  90 tablet    Refill:  0    Refills wot PCP Dr. Claris Gower    RECOMMENDATIONS: Justin Coleman is a 88 y.o. male whose past medical history and cardiac risk factors include: microvascular angina, hypertension, sleep apnea, former smoker, advanced age, morbid obesity.  He is now scheduled for back surgery by Dr. Kary Kos.  His cardiac catheterization on 06/27/2019 revealed no significant coronary artery disease but slow filling suggestive of microvascular disease.  Since being on aggressive medical therapy, he has not had any recurrence of angina pectoris and he remains asymptomatic.  His leg edema related to partly from amlodipine and a partly also from morbid obesity, no clinical evidence of heart failure.  I would like to use spironolactone 25 mg in the morning,, obtain BMP, A1c at the request of his PCP and also will do TSH 1 week to 10 days  after starting spironolactone.  Eventually we could discontinue amlodipine if blood pressure is well controlled, he is advised to follow-up with his PCP.  Hopefully this will also help with leg edema.  His lipids are well controlled.  Weight loss again discussed with the patient.  From cardiac standpoint he remains stable, we will see him back on a as needed basis.  From cardiac standpoint I do not see any contraindication for him to have back surgery with low risk.

## 2021-08-05 NOTE — Progress Notes (Signed)
Justin Coleman Date of Birth: Jul 26, 1932 MRN: [id number] Primary Care Provider:Elkins, Curt Jews, MD Former Cardiology Providers: Dr. Wynonia Lawman  Primary Cardiologist: Adrian Prows, MD, [facility] (established care 06/19/2019)  Date: 08/05/21 Last Office Visit: 05/31/2020  Chief Complaint  Patient presents with   Follow-up    6 month   Coronary Artery Disease   Shortness of Breath   HPI  Justin Coleman is a 88 y.o. male who presents to the office with a chief complaint of " 6 month follow up for angina and shortness of breath." Patient's past medical history and cardiac risk factors include: Coronary artery disease, microvascular angina, hypertension, former smoker, sleep apnea, advanced age, obesity.   Patient is accompanied by his wife at today's office visit.  Patient was referred to the office back in April 2021 at the request of his primary care for evaluation of chest pain and shortness of breath.  Given his symptoms and risk factors underwent LHC on 06/27/2019 and was noted have microvascular angina and possible coronary spasm.   Since last office visit patient doing well from a cardiovascular standpoint.  He denies any chest pain at rest or with effort related activities.  No use of sublingual nitroglycerin tablets.  Remains euvolemic on physical examination.  His shortness of breath with effort related activities remains chronic and stable.  He has lost 4 pounds due to lifestyle changes since the last office visit for which he is congratulated for during the visit.  He is compliant with the CPAP machine approximately 45 hours on a daily basis.  ALLERGIES: Allergies  Allergen Reactions   Oxycodone Nausea Only    Abdominal pain    MEDICATION LIST PRIOR TO VISIT: Current Outpatient Medications on File Prior to Visit  Medication Sig Dispense Refill   amLODipine (NORVASC) 2.5 MG tablet Take 2.5 mg by mouth daily.     amoxicillin (AMOXIL) 500 MG capsule Take 2,000 mg by mouth  See admin instructions. TAKE 4 CAPSULES (2000 MG) BY MOUTH 1 HOUR PRIOR TO DENTAL APPOINTMENTS.     aspirin EC 81 MG tablet Take 81 mg by mouth daily.     atorvastatin (LIPITOR) 10 MG tablet TAKE 1 TABLET BY MOUTH EACH NIGHT AT BEDTIME 90 tablet 1   hydrochlorothiazide (HYDRODIURIL) 50 MG tablet Take 50 mg by mouth daily.      isosorbide mononitrate (IMDUR) 30 MG 24 hr tablet Take 15 mg by mouth every evening.      losartan (COZAAR) 100 MG tablet Take 100 mg by mouth every evening.      metoprolol succinate (TOPROL-XL) 50 MG 24 hr tablet TAKE 1 TABLET BY MOUTH EVERY MORNING. HOLD IF SYSTOLIC BLOOD PRESSURE IS LESS THAN 100 MMHG OR HEART RATE IS LESS THAN60 BPM 90 tablet 1   nitroGLYCERIN (NITROSTAT) 0.4 MG SL tablet Place 0.4 mg under the tongue every 5 (five) minutes x 3 doses as needed for chest pain.      Polyethyl Glycol-Propyl Glycol (SYSTANE ULTRA) 0.4-0.3 % SOLN Apply to eye.     psyllium (METAMUCIL) 58.6 % powder Take 1 packet by mouth daily as needed (Constipation).      tamsulosin (FLOMAX) 0.4 MG CAPS capsule Take 0.8 mg by mouth daily after supper. 30 minutes after supper     No current facility-administered medications on file prior to visit.    PAST MEDICAL HISTORY: Past Medical History:  Diagnosis Date   Arthritis    BPH (benign prostatic hyperplasia)    Coronary artery disease  non obstructive   Dyspnea    with exertion   GERD (gastroesophageal reflux disease)    Hypertension    Lumbago    Sleep apnea     PAST SURGICAL HISTORY: Past Surgical History:  Procedure Laterality Date   APPENDECTOMY     BACK SURGERY  1993   CARDIAC CATHETERIZATION     CERVICAL FUSION     age 45    COLONOSCOPY     EYE SURGERY Bilateral    cataract   LEFT HEART CATH AND CORONARY ANGIOGRAPHY N/A 06/27/2019   Procedure: LEFT HEART CATH AND CORONARY ANGIOGRAPHY;  Surgeon: Adrian Prows, MD;  Location: [facility];  Service: Cardiovascular;  Laterality: N/A;   LUMBAR  LAMINECTOMY/DECOMPRESSION MICRODISCECTOMY Left 10/25/2019   Procedure: Left Extraforaminal Lumbar Three-Four Microdiscectomy;  Surgeon: Kary Kos, MD;  Location: [facility];  Service: Neurosurgery;  Laterality: Left;   testicular growth     TONSILLECTOMY     TOTAL KNEE ARTHROPLASTY Bilateral 2006/2007    FAMILY HISTORY: The patient family history includes Hypertension in his mother.   SOCIAL HISTORY:  The patient  reports that he quit smoking about 56 years ago. His smoking use included cigarettes. He has a 20.00 pack-year smoking history. He has never used smokeless tobacco. He reports that he does not drink alcohol and does not use drugs.  REVIEW OF SYSTEMS: Review of Systems  Cardiovascular:  Positive for dyspnea on exertion and leg swelling. Negative for chest pain.  Musculoskeletal:  Positive for back pain.   PHYSICAL EXAM:    08/05/2021    1:27 PM 08/05/2021    1:16 PM 12/02/2020   10:38 AM  Vitals with BMI  Height  $Remov'5\' 10"'RZfXcx$  $RemoveB'5\' 10"'CRBPUeMW$   Weight  294 lbs 10 oz 285 lbs  BMI  93.55 21.74  Systolic 715 953 967  Diastolic 61 63 63  Pulse 69 66 62   Physical Exam Neck:     Vascular: No JVD.  Cardiovascular:     Rate and Rhythm: Normal rate and regular rhythm.     Pulses: Intact distal pulses.          Dorsalis pedis pulses are 1+ on the right side and 1+ on the left side.       Posterior tibial pulses are 1+ on the right side and 1+ on the left side.     Heart sounds: Normal heart sounds. No murmur heard.   No gallop.  Pulmonary:     Effort: Pulmonary effort is normal.     Breath sounds: Normal breath sounds.  Abdominal:     General: Abdomen is protuberant. Bowel sounds are normal.     Palpations: Abdomen is soft.  Musculoskeletal:     Right lower leg: Edema (2+ below knee) present.     Left lower leg: Edema (2+ below knee) present.   CARDIAC DATABASE:  EKG:  EKG 08/05/2021: Sinus rhythm with first-degree block at rate of 68 bpm, left atrial abnormality, normal axis.  Incomplete  right bundle branch block.  Poor R wave progression, cannot exclude anterolateral infarct old.  Compared to 12/02/2020, poor R wave progression is new.   Echocardiogram: 06/22/2019: LVEF 60-65%, moderate LVH, indeterminate diastolic filling pattern, elevated left atrial pressure, aortic valve sclerosis without stenosis, mild MR, mild TR.  Stress Testing: None  Heart Catheterization: 06/27/19:  Normal LV systolic function, normal EDP. Right coronary artery with a mid 20% stenosis.  Significant improvement in flow with nitroglycerin administration suggestive of mild coronary spasm  and microvascular angina. Left main normal. Circumflex moderate size, normal. Ramus intermediate very small, normal. LAD large, smooth and normal, gives origin to a small to moderate-sized D1 which is a ostial 60% stenosis which is 1.5 mm in diameter.  Recommendation: Chest pain symptoms could be related to microvascular angina and also possibly coronary spasm.  Patient will be discharged home on amlodipine in addition to the current medical regimen.  45 mL contrast utilized.  LABORATORY DATA:  External Labs:  Cholesterol, total 104.000 m 07/15/2020 HDL 40.000 mg 07/15/2020 LDL 44.000 MG 07/15/2020 Triglycerides 101.000 m 07/15/2020  Hemoglobin 14.000 g/d 10/24/2019  Creatinine, Serum 0.800 mg/ 07/15/2020  Potassium 4.200 mm 07/15/2020 ALT (SGPT) 15.000 IU/ 07/15/2020   Collected: 08/11/2019  A1C 6.100 % 10/24/2019  Creatinine 0.73 mg/dL. eGFR: 85 mL/min per 1.73 m AST 16, ALT 16 alkaline phosphatase 61  FINAL MEDICATION LIST END OF ENCOUNTER: No orders of the defined types were placed in this encounter.    Current Outpatient Medications:    amLODipine (NORVASC) 2.5 MG tablet, Take 2.5 mg by mouth daily., Disp: , Rfl:    amoxicillin (AMOXIL) 500 MG capsule, Take 2,000 mg by mouth See admin instructions. TAKE 4 CAPSULES (2000 MG) BY MOUTH 1 HOUR PRIOR TO DENTAL APPOINTMENTS., Disp: , Rfl:    aspirin EC  81 MG tablet, Take 81 mg by mouth daily., Disp: , Rfl:    atorvastatin (LIPITOR) 10 MG tablet, TAKE 1 TABLET BY MOUTH EACH NIGHT AT BEDTIME, Disp: 90 tablet, Rfl: 1   hydrochlorothiazide (HYDRODIURIL) 50 MG tablet, Take 50 mg by mouth daily. , Disp: , Rfl:    isosorbide mononitrate (IMDUR) 30 MG 24 hr tablet, Take 15 mg by mouth every evening. , Disp: , Rfl:    losartan (COZAAR) 100 MG tablet, Take 100 mg by mouth every evening. , Disp: , Rfl:    metoprolol succinate (TOPROL-XL) 50 MG 24 hr tablet, TAKE 1 TABLET BY MOUTH EVERY MORNING. HOLD IF SYSTOLIC BLOOD PRESSURE IS LESS THAN 100 MMHG OR HEART RATE IS LESS THAN60 BPM, Disp: 90 tablet, Rfl: 1   nitroGLYCERIN (NITROSTAT) 0.4 MG SL tablet, Place 0.4 mg under the tongue every 5 (five) minutes x 3 doses as needed for chest pain. , Disp: , Rfl:    Polyethyl Glycol-Propyl Glycol (SYSTANE ULTRA) 0.4-0.3 % SOLN, Apply to eye., Disp: , Rfl:    psyllium (METAMUCIL) 58.6 % powder, Take 1 packet by mouth daily as needed (Constipation). , Disp: , Rfl:    tamsulosin (FLOMAX) 0.4 MG CAPS capsule, Take 0.8 mg by mouth daily after supper. 30 minutes after supper, Disp: , Rfl:   IMPRESSION:    ICD-10-CM   1. Microvascular angina (HCC)  I20.8     2. Essential hypertension  I10 EKG 12-Lead    3. Class 3 severe obesity due to excess calories without serious comorbidity with body mass index (BMI) of 40.0 to 44.9 in adult (HCC)  E66.01    Z68.41     4. Hyperglycemia  R73.9        RECOMMENDATIONS: Justin Coleman is a 88 y.o. male whose past medical history and cardiac risk factors include: Coronary artery disease, microvascular angina, hypertension, sleep apnea, former smoker, advanced age, obesity.  Microvascular angina: Chest pain-free. No use of sublingual nitroglycerin tablets since last office encounter. Medications reconciled. Educated on the importance of secondary prevention. Has tolerated the initiation of Lipitor 10 mg p.o. nightly.  With  follow-up blood work in 6-week notes stable liver  function and improvement in lipid profile. Implementing lifestyle changes to facilitate weight loss.  He has lost 4 pounds over the course of 6 months. No hospitalizations or urgent care visits for cardiovascular symptoms since last encounter  Dyspnea on exertion:  Chronic and stable Overall euvolemic, LVEDP recent heart catheterization was normal. Monitor for now.  Coronary artery disease: Continue current management.  See above  Benign essential hypertension:  Improving Office blood pressures are very well controlled. Medications reconciled. Low-salt diet recommended. Congratulated with regards to his weight loss since last office encounter. Educated in the importance of improving his modifiable cardiovascular risk factors and increasing physical activity as tolerated with a goal of 30 minutes a day 5 days a week.  Former smoker: Educated on the importance of continued smoking cessation.  Lipid profile and labs from 07/15/2020 independently reviewed and noted above for further reference.  Orders Placed This Encounter  Procedures   EKG 12-Lead   --Continue cardiac medications as reconciled in final medication list. --No follow-ups on file. Or sooner if needed. --Continue follow-up with your primary care physician regarding the management of your other chronic comorbid conditions.  Patient's questions and concerns were addressed to his satisfaction. He voices understanding of the instructions provided during this encounter.   This note was created using a voice recognition software as a result there may be grammatical errors inadvertently enclosed that do not reflect the nature of this encounter. Every attempt is made to correct such errors.   Rex Kras, [place], [facility]  Pager: [phone] Office: [phone]

## 2021-08-13 ENCOUNTER — Other Ambulatory Visit: Payer: Self-pay | Admitting: Neurosurgery

## 2021-08-16 LAB — BASIC METABOLIC PANEL
BUN/Creatinine Ratio: 24 (ref 10–24)
BUN: 26 mg/dL (ref 8–27)
CO2: 22 mmol/L (ref 20–29)
Calcium: 9.7 mg/dL (ref 8.6–10.2)
Chloride: 102 mmol/L (ref 96–106)
Creatinine, Ser: 1.07 mg/dL (ref 0.76–1.27)
Glucose: 115 mg/dL — ABNORMAL HIGH (ref 70–99)
Potassium: 4.6 mmol/L (ref 3.5–5.2)
Sodium: 140 mmol/L (ref 134–144)
eGFR: 68 mL/min/{1.73_m2} (ref >59)

## 2021-08-16 LAB — HGB A1C W/O EAG: Hgb A1c MFr Bld: 6 % — ABNORMAL HIGH (ref 4.8–5.6)

## 2021-08-16 LAB — TSH: TSH: 4.8 u[IU]/mL — ABNORMAL HIGH (ref 0.450–4.500)

## 2021-08-17 NOTE — Progress Notes (Signed)
I have forwarded this to his PCP. His thyroid function is minimally abnormal and just needs f/u in 6 months or so. Mildly elevated p;rediabetes level A1c. Otherwise normal

## 2021-08-19 ENCOUNTER — Other Ambulatory Visit: Payer: Self-pay

## 2021-08-19 ENCOUNTER — Encounter (HOSPITAL_COMMUNITY): Payer: Self-pay | Admitting: Neurosurgery

## 2021-08-19 NOTE — Anesthesia Preprocedure Evaluation (Signed)
Anesthesia Evaluation  Patient identified by MRN, date of birth, ID band Patient awake    Reviewed: Allergy & Precautions, NPO status , Patient's Chart, lab work & pertinent test results  Airway Mallampati: II  TM Distance: >3 FB Neck ROM: Full    Dental no notable dental hx. (+) Teeth Intact, Dental Advisory Given   Pulmonary neg pulmonary ROS, sleep apnea , former smoker,    Pulmonary exam normal breath sounds clear to auscultation       Cardiovascular hypertension, Pt. on medications and Pt. on home beta blockers + CAD ('21 Cath: mild, non-obstructive)  Normal cardiovascular exam Rhythm:Regular Rate:Normal  '21 ECHO: normal LVF, EF 60-65%. Left ventricle cavity  is normal in size. Moderate LVH. Normal global wall motion.  Calculated EF 66%.  Aortic sclerosis without stenosis.  Mild (Grade I) mitral regurgitation.  Mild tricuspid regurgitation.    Neuro/Psych negative neurological ROS  negative psych ROS   GI/Hepatic Neg liver ROS, GERD  Medicated,  Endo/Other  Morbid obesity  Renal/GU negative Renal ROS  negative genitourinary   Musculoskeletal negative musculoskeletal ROS (+)   Abdominal   Peds negative pediatric ROS (+)  Hematology negative hematology ROS (+)   Anesthesia Other Findings   Reproductive/Obstetrics negative OB ROS                          Anesthesia Physical Anesthesia Plan  ASA: 3  Anesthesia Plan: General   Post-op Pain Management: Tylenol PO (pre-op)*   Induction: Intravenous  PONV Risk Score and Plan: 2 and Ondansetron and Dexamethasone  Airway Management Planned: Oral ETT  Additional Equipment: None  Intra-op Plan:   Post-operative Plan: Extubation in OR  Informed Consent:   Plan Discussed with:   Anesthesia Plan Comments: (PAT note written 08/19/2021 by Shonna Chock, PA-C.  Patient is an 88 year old male scheduled for the above  procedure.  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   Last cardiology follow-up with Dr. Jacinto Halim was on 08/05/21. He was doing well from a cardiac standpoint. No chest pain or SL Nitro use. Euvolemic. DOE chronic and stable. Compliant with CPAP. Medications include amlodipine, Imdur, Toprol, losartan, ASA 81 mg, Lipitor, spironolactone, HCTZ, Flomax, Nitro PRN. He wrote (see Letters tab), "CEDERICK BROADNAX is at low risk, from a cardiac standpoint, for his upcoming procedure: Lumbar surgery.  It is ok to proceed without further cardiac testing.  If applicable can hold ASA for 7  day(s) prior to procedure and re-start 5-7  days post procedure."  He is a same day work-up, so labs as indicated and anesthesia team evaluation on the day of surgery.    EKG 08/05/2021: Sinus rhythm with first-degree block at rate of 68 bpm, left atrial abnormality, normal axis.  Incomplete right bundle branch block.  Poor R wave progression, cannot exclude anterolateral infarct old.  Compared to 12/02/2020, poor R wave progression is new.    Left Heart Catheterization 06/27/19: Normal LV systolic function, normal EDP. Right coronary artery with a mid 20% stenosis. Significant improvement in flow with nitroglycerin administration suggestive of mild coronary spasm and microvascular angina. Left main normal. Circumflex moderate size, normal. Ramus intermediate very small, normal. LAD large, smooth and normal, gives origin to a small to moderate-sized D1 which is a  ostial 60% stenosis which is 1.5 mm in diameter.  Recommendation: Chest pain symptoms could be related to microvascular angina and also possibly coronary spasm. Patient will be discharged home on  amlodipine in addition to the current medical regimen. 45 mL contrast utilized.   Echocardiogram 06/22/2019:  Normal LV systolic function with visual EF 60-65%. Left ventricle cavity  is normal in size. Moderate left ventricular hypertrophy. Normal global  wall motion. Indeterminate diastolic filling pattern, elevated LAP.  Calculated EF 66%.  Aortic sclerosis without stenosis.  Mild (Grade I) mitral regurgitation.  Mild tricuspid regurgitation.  No prior study for comparison)     Anesthesia Quick Evaluation

## 2021-08-19 NOTE — Pre-Procedure Instructions (Signed)
[facility] - [place], [place] - [address] [place] [address] Phone: [phone] Fax: [phone]   PCP - Kaleen Mask, MD Cardiologist - Yates Decamp, MD    EKG - 08/05/21 ECHO - 06/22/19 Cardiac Cath - 06/27/19  CPAP - Wears every night  Aspirin Instructions: On hold. Last dose:  ERAS Protcol - Clears until 0930   Anesthesia review: Y  Patient verbally denies any shortness of breath, fever, cough and chest pain during phone call   -------------  SDW INSTRUCTIONS given:  Your procedure is scheduled on 08/20/21.  Report to [facility] Main Entrance "A" at 1000 A.M., and check in at the Admitting office.  Call this number if you have problems the morning of surgery:  [phone]   Remember:  Do not eat after midnight the night before your surgery  You may drink clear liquids until 0930 the morning of your surgery.   Clear liquids allowed are: Water, Non-Citrus Juices (without pulp), Carbonated Beverages, Clear Tea, Black Coffee Only, and Gatorade    Take these medicines the morning of surgery with A SIP OF WATER  atorvastatin (LIPITOR)  metoprolol succinate (TOPROL-XL) Polyethyl Glycol-Propyl Glycol (SYSTANE ULTRA)-if needed   As of today, STOP taking any Aspirin (unless otherwise instructed by your surgeon) Aleve, Naproxen, Ibuprofen, Motrin, Advil, Goody's, BC's, all herbal medications, fish oil, and all vitamins.                      Do not wear jewelry, make up, or nail polish            Do not wear lotions, powders, perfumes/colognes, or deodorant.            Do not shave 48 hours prior to surgery.  Men may shave face and neck.            Do not bring valuables to the hospital.            [facility] is not responsible for any belongings or valuables.  Do NOT Smoke (Tobacco/Vaping) 24 hours prior to your procedure If you use a CPAP at night, you may bring all equipment for your  overnight stay.   Contacts, glasses, dentures or bridgework may not be worn into surgery.      For patients admitted to the hospital, discharge time will be determined by your treatment team.   Patients discharged the day of surgery will not be allowed to drive home, and someone needs to stay with them for 24 hours.    Special instructions:   [facility]- Preparing For Surgery  Before surgery, you can play an important role. Because skin is not sterile, your skin needs to be as free of germs as possible. You can reduce the number of germs on your skin by washing with CHG (chlorahexidine gluconate) Soap before surgery.  CHG is an antiseptic cleaner which kills germs and bonds with the skin to continue killing germs even after washing.    Oral Hygiene is also important to reduce your risk of infection.  Remember - BRUSH YOUR TEETH THE MORNING OF SURGERY WITH YOUR REGULAR TOOTHPASTE  Please do not use if you have an allergy to CHG or antibacterial soaps. If your skin becomes reddened/irritated stop using the CHG.  Do not shave (including legs and underarms) for at least 48 hours prior to first CHG shower. It is OK to shave your face.  Please follow these instructions carefully.   Shower the NIGHT BEFORE SURGERY and the MORNING OF SURGERY with DIAL Soap.   Pat yourself dry with a CLEAN TOWEL.  Wear CLEAN PAJAMAS to bed the night before surgery  Place CLEAN SHEETS on your bed the night of your first shower and DO NOT SLEEP WITH PETS.   Day of Surgery: Please shower morning of surgery  Wear Clean/Comfortable clothing the morning of surgery Do not apply any deodorants/lotions.   Remember to brush your teeth WITH YOUR REGULAR TOOTHPASTE.   Questions were answered. Patient verbalized understanding of instructions.

## 2021-08-19 NOTE — Progress Notes (Signed)
Anesthesia Chart Review: SAME DAY WORK-UP   Case: [id number] Date/Time: 08/20/21 1215   Procedure: Microdiscectomy - L1-L2 - right - L2-L3 - right (Right: Back) - 3C   Anesthesia type: General   Pre-op diagnosis: HNP   [facility] / [facility]   Surgeons: Donalee Citrin, MD       DISCUSSION: Patient is an 88 year old male scheduled for the above procedure.  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   Last cardiology follow-up with Dr. Jacinto Halim was on 08/05/21. He was doing well from a cardiac standpoint. No chest pain or SL Nitro use. Euvolemic. DOE chronic and stable. Compliant with CPAP. Medications include amlodipine, Imdur, Toprol, losartan, ASA 81 mg, Lipitor, spironolactone, HCTZ, Flomax, Nitro PRN. He wrote (see Letters tab), "Justin Coleman is at low risk, from a cardiac standpoint, for his upcoming procedure: Lumbar surgery.  It is ok to proceed without further cardiac testing.  If applicable can hold ASA for 7  day(s) prior to procedure and re-start 5-7  days post procedure."   He is a same day work-up, so labs as indicated and anesthesia team evaluation on the day of surgery.   VS: Ht 5\' 10"  (1.778 m)   Wt 133.6 kg   BMI 42.26 kg/m  BP Readings from Last 3 Encounters:  08/05/21 133/61  12/02/20 131/63  05/31/20 (!) 141/76   Pulse Readings from Last 3 Encounters:  08/05/21 69  12/02/20 62  05/31/20 62     PROVIDERS: 07/31/20, MD is PCP  Kaleen Mask, MD is cardiologist   LABS: Labs as indicated on the day of surgery. Most recent lab results include: Lab Results  Component Value Date   ALT 15 07/15/2020   AST 17 07/15/2020   NA 140 08/15/2021   K 4.6 08/15/2021   CL 102 08/15/2021   CREATININE  1.07 08/15/2021   BUN 26 08/15/2021   CO2 22 08/15/2021   TSH 4.800 (H) 08/15/2021   HGBA1C 6.0 (H) 08/15/2021     IMAGES: MRI L-spine 05/28/2021: IMPRESSION: L1-2: Enlargement of a right posterolateral to foraminal disc herniation with stenosis of the right lateral recess and foramen on the right that could be symptomatic.   L2-3: Disc bulge and facet hypertrophy. Previous posterior decompression. Mild lateral recess and foraminal narrowing but without visible neural compression.   L3-4: Endplate osteophytes and bulging of the disc. Facet and ligamentous prominence. Mild stenosis of the left lateral recess and intervertebral foramen on the left. Postoperative enhancement on the left.   L4-5: Distant left hemilaminectomy and discectomy. No stenosis or change at this level.   L5-S1: Left posterolateral osteophytes and bulging of the disc with left-sided facet degeneration. Slight worsening of stenosis of the left lateral recess and intervertebral foramen on the left.    EKG: EKG 08/05/2021: Sinus rhythm with first-degree block at rate of 68 bpm, left atrial abnormality, normal axis.  Incomplete right bundle branch block.  Poor R wave progression, cannot exclude anterolateral infarct old.  Compared to 12/02/2020, poor R wave progression is new.   CV: Left Heart Catheterization 06/27/19:  Normal LV systolic function, normal EDP. Right coronary artery with a mid 20% stenosis.  Significant improvement in flow with nitroglycerin administration suggestive of mild coronary spasm and microvascular angina. Left main  normal. Circumflex moderate size, normal. Ramus intermediate very small, normal. LAD large, smooth and normal, gives origin to a small to moderate-sized D1 which is a ostial 60% stenosis which is 1.5 mm in diameter.   Recommendation: Chest pain symptoms could be related to microvascular angina and also possibly coronary spasm.  Patient will be discharged home on amlodipine  in addition to the current medical regimen.  45 mL contrast utilized.    Echocardiogram 06/22/2019:  Normal LV systolic function with visual EF 60-65%. Left ventricle cavity  is normal in size. Moderate left ventricular hypertrophy. Normal global  wall motion. Indeterminate diastolic filling pattern, elevated LAP.  Calculated EF 66%.  Aortic sclerosis without stenosis.  Mild (Grade I) mitral regurgitation.  Mild tricuspid regurgitation.  No prior study for comparison.   Past Medical History:  Diagnosis Date   Arthritis    BPH (benign prostatic hyperplasia)    Coronary artery disease    non obstructive   Dyspnea    with exertion   GERD (gastroesophageal reflux disease)    Hypertension    Lumbago    Sleep apnea     Past Surgical History:  Procedure Laterality Date   APPENDECTOMY     BACK SURGERY  1993   CARDIAC CATHETERIZATION     CERVICAL FUSION     age 70    COLONOSCOPY     EYE SURGERY Bilateral    cataract   LEFT HEART CATH AND CORONARY ANGIOGRAPHY N/A 06/27/2019   Procedure: LEFT HEART CATH AND CORONARY ANGIOGRAPHY;  Surgeon: Yates Decamp, MD;  [facility];  Service: Cardiovascular;  Laterality: N/A;   LUMBAR LAMINECTOMY/DECOMPRESSION MICRODISCECTOMY Left 10/25/2019   Procedure: Left Extraforaminal Lumbar Three-Four Microdiscectomy;  Surgeon: Donalee Citrin, MD;  Location: [facility] OR;  Service: Neurosurgery;  Laterality: Left;   testicular growth     TONSILLECTOMY     TOTAL KNEE ARTHROPLASTY Bilateral 2006/2007    MEDICATIONS: No current facility-administered medications for this encounter.    amLODipine (NORVASC) 5 MG tablet   atorvastatin (LIPITOR) 10 MG tablet   hydrochlorothiazide (HYDRODIURIL) 50 MG tablet   hypromellose (SYSTANE OVERNIGHT THERAPY) 0.3 % GEL ophthalmic ointment   isosorbide mononitrate (IMDUR) 30 MG 24 hr tablet   losartan (COZAAR) 100 MG tablet   metoprolol succinate (TOPROL-XL) 50 MG 24 hr tablet   nitroGLYCERIN (NITROSTAT) 0.4 MG  SL tablet   Polyethyl Glycol-Propyl Glycol (SYSTANE ULTRA) 0.4-0.3 % SOLN   Polyethyl Glycol-Propyl Glycol (SYSTANE) 0.4-0.3 % GEL ophthalmic gel   psyllium (METAMUCIL) 58.6 % powder   spironolactone (ALDACTONE) 25 MG tablet   tamsulosin (FLOMAX) 0.4 MG CAPS capsule   amoxicillin (AMOXIL) 500 MG capsule   aspirin EC 81 MG tablet  ASA on hold for surgery.  Shonna Chock, PA-C Surgical Short Stay/Anesthesiology [facility] Phone [phone] [facility] Phone [phone] 08/19/2021 9:40 AM

## 2021-08-20 ENCOUNTER — Ambulatory Visit (HOSPITAL_COMMUNITY): Payer: Medicare PPO | Admitting: Vascular Surgery

## 2021-08-20 ENCOUNTER — Other Ambulatory Visit: Payer: Self-pay

## 2021-08-20 ENCOUNTER — Ambulatory Visit (HOSPITAL_COMMUNITY): Payer: Medicare PPO

## 2021-08-20 ENCOUNTER — Encounter (HOSPITAL_COMMUNITY): Payer: Self-pay | Admitting: Neurosurgery

## 2021-08-20 ENCOUNTER — Encounter (HOSPITAL_COMMUNITY)
Admission: RE | Disposition: A | Discharge status: Home / Self Care | Payer: Self-pay | Source: Ambulatory Visit | Attending: Neurosurgery

## 2021-08-20 ENCOUNTER — Ambulatory Visit (HOSPITAL_BASED_OUTPATIENT_CLINIC_OR_DEPARTMENT_OTHER): Payer: Medicare PPO | Admitting: Vascular Surgery

## 2021-08-20 ENCOUNTER — Ambulatory Visit (HOSPITAL_COMMUNITY)
Admission: RE | Admit: 2021-08-20 | Discharge: 2021-08-21 | Disposition: A | Discharge status: Home / Self Care | Payer: Medicare PPO | Source: Ambulatory Visit | Attending: Neurosurgery | Admitting: Neurosurgery

## 2021-08-20 DIAGNOSIS — I2510 Atherosclerotic heart disease of native coronary artery without angina pectoris: Secondary | ICD-10-CM | POA: Diagnosis not present

## 2021-08-20 DIAGNOSIS — K219 Gastro-esophageal reflux disease without esophagitis: Secondary | ICD-10-CM | POA: Diagnosis not present

## 2021-08-20 DIAGNOSIS — M48061 Spinal stenosis, lumbar region without neurogenic claudication: Secondary | ICD-10-CM | POA: Insufficient documentation

## 2021-08-20 DIAGNOSIS — M5126 Other intervertebral disc displacement, lumbar region: Secondary | ICD-10-CM | POA: Diagnosis present

## 2021-08-20 DIAGNOSIS — M5116 Intervertebral disc disorders with radiculopathy, lumbar region: Secondary | ICD-10-CM | POA: Diagnosis not present

## 2021-08-20 DIAGNOSIS — Z6841 Body Mass Index (BMI) 40.0 and over, adult: Secondary | ICD-10-CM | POA: Diagnosis not present

## 2021-08-20 DIAGNOSIS — Z87891 Personal history of nicotine dependence: Secondary | ICD-10-CM | POA: Diagnosis not present

## 2021-08-20 DIAGNOSIS — I10 Essential (primary) hypertension: Secondary | ICD-10-CM

## 2021-08-20 LAB — BASIC METABOLIC PANEL
Anion gap: 8 (ref 5–15)
BUN: 24 mg/dL — ABNORMAL HIGH (ref 8–23)
CO2: 25 mmol/L (ref 22–32)
Calcium: 9.4 mg/dL (ref 8.9–10.3)
Chloride: 105 mmol/L (ref 98–111)
Creatinine, Ser: 1.11 mg/dL (ref 0.61–1.24)
GFR, Estimated: >60 mL/min (ref >60)
Glucose, Bld: 117 mg/dL — ABNORMAL HIGH (ref 70–99)
Potassium: 4 mmol/L (ref 3.5–5.1)
Sodium: 138 mmol/L (ref 135–145)

## 2021-08-20 LAB — CBC
HCT: 41 % (ref 39.0–52.0)
Hemoglobin: 14.2 g/dL (ref 13.0–17.0)
MCH: 32.9 pg (ref 26.0–34.0)
MCHC: 34.6 g/dL (ref 30.0–36.0)
MCV: 94.9 fL (ref 80.0–100.0)
Platelets: 188 10*3/uL (ref 150–400)
RBC: 4.32 MIL/uL (ref 4.22–5.81)
RDW: 13.2 % (ref 11.5–15.5)
WBC: 5.1 10*3/uL (ref 4.0–10.5)
nRBC: 0 % (ref 0.0–0.2)

## 2021-08-20 SURGERY — LUMBAR LAMINECTOMY/DECOMPRESSION MICRODISCECTOMY 2 LEVELS
Anesthesia: General | Site: Spine Lumbar | Laterality: Right

## 2021-08-20 MED ORDER — LIDOCAINE 2% (20 MG/ML) 5 ML SYRINGE
INTRAMUSCULAR | Status: AC
Start: 2021-08-20 — End: ?
  Filled 2021-08-20: qty 5

## 2021-08-20 MED ORDER — OXYCODONE HCL 5 MG/5ML PO SOLN
ORAL | Status: AC
  Filled 2021-08-20: qty 5

## 2021-08-20 MED ORDER — CEFAZOLIN SODIUM-DEXTROSE 2-4 GM/100ML-% IV SOLN
2.0000 g | Freq: Three times a day (TID) | INTRAVENOUS | Status: AC
  Administered 2021-08-20 – 2021-08-21 (×2): 2 g via INTRAVENOUS
  Filled 2021-08-20 (×2): qty 100

## 2021-08-20 MED ORDER — SODIUM CHLORIDE 0.9% FLUSH: 3.0000 mL | INTRAVENOUS | Status: DC | PRN

## 2021-08-20 MED ORDER — CHLORHEXIDINE GLUCONATE CLOTH 2 % EX PADS: 6.0000 | MEDICATED_PAD | Freq: Once | CUTANEOUS | Status: DC

## 2021-08-20 MED ORDER — PHENOL 1.4 % MT LIQD: 1.0000 | OROMUCOSAL | Status: DC | PRN

## 2021-08-20 MED ORDER — PROPOFOL 10 MG/ML IV BOLUS
INTRAVENOUS | Status: AC
  Filled 2021-08-20: qty 20

## 2021-08-20 MED ORDER — MENTHOL 3 MG MT LOZG
1.0000 | LOZENGE | OROMUCOSAL | Status: DC | PRN
Start: 2021-08-20 — End: 2021-08-21

## 2021-08-20 MED ORDER — OXYCODONE HCL 5 MG PO TABS
5.0000 mg | ORAL_TABLET | Freq: Once | ORAL | Status: AC | PRN
  Administered 2021-08-20: 5 mg via ORAL

## 2021-08-20 MED ORDER — HYDROMORPHONE HCL 1 MG/ML IJ SOLN: 0.2500 mg | INTRAMUSCULAR | Status: DC | PRN

## 2021-08-20 MED ORDER — ONDANSETRON HCL 4 MG PO TABS
4.0000 mg | ORAL_TABLET | Freq: Four times a day (QID) | ORAL | Status: DC | PRN
Start: 2021-08-20 — End: 2021-08-21

## 2021-08-20 MED ORDER — PROPOFOL 10 MG/ML IV BOLUS
INTRAVENOUS | Status: DC | PRN
  Administered 2021-08-20: 100 mg via INTRAVENOUS
  Administered 2021-08-20: 30 mg via INTRAVENOUS

## 2021-08-20 MED ORDER — HYDROMORPHONE HCL 1 MG/ML IJ SOLN: 0.5000 mg | INTRAMUSCULAR | Status: DC | PRN

## 2021-08-20 MED ORDER — ATORVASTATIN CALCIUM 10 MG PO TABS
10.0000 mg | ORAL_TABLET | Freq: Every morning | ORAL | Status: DC
  Administered 2021-08-21: 10 mg via ORAL
  Filled 2021-08-20: qty 1

## 2021-08-20 MED ORDER — LIDOCAINE-EPINEPHRINE 1 %-1:100000 IJ SOLN
INTRAMUSCULAR | Status: DC | PRN
  Administered 2021-08-20: 10 mL

## 2021-08-20 MED ORDER — CHLORHEXIDINE GLUCONATE 0.12 % MT SOLN
15.0000 mL | Freq: Once | OROMUCOSAL | Status: AC
  Administered 2021-08-20: 15 mL via OROMUCOSAL
  Filled 2021-08-20: qty 15

## 2021-08-20 MED ORDER — PANTOPRAZOLE SODIUM 40 MG IV SOLR: 40.0000 mg | Freq: Every day | INTRAVENOUS | Status: DC

## 2021-08-20 MED ORDER — LIDOCAINE 2% (20 MG/ML) 5 ML SYRINGE
INTRAMUSCULAR | Status: DC | PRN
  Administered 2021-08-20: 60 mg via INTRAVENOUS

## 2021-08-20 MED ORDER — OXYCODONE HCL 5 MG PO TABS: 5.0000 mg | ORAL_TABLET | Freq: Once | ORAL | Status: DC | PRN

## 2021-08-20 MED ORDER — OXYCODONE HCL 5 MG/5ML PO SOLN: 5.0000 mg | Freq: Once | ORAL | Status: AC | PRN

## 2021-08-20 MED ORDER — SPIRONOLACTONE 25 MG PO TABS: 25.0000 mg | ORAL_TABLET | ORAL | Status: DC

## 2021-08-20 MED ORDER — MIDAZOLAM HCL 2 MG/2ML IJ SOLN: 0.5000 mg | Freq: Once | INTRAMUSCULAR | Status: DC | PRN

## 2021-08-20 MED ORDER — HEMOSTATIC AGENTS (NO CHARGE) OPTIME
TOPICAL | Status: DC | PRN
  Administered 2021-08-20: 1 via TOPICAL

## 2021-08-20 MED ORDER — FENTANYL CITRATE (PF) 100 MCG/2ML IJ SOLN: 25.0000 ug | INTRAMUSCULAR | Status: DC | PRN

## 2021-08-20 MED ORDER — OXYCODONE HCL 5 MG/5ML PO SOLN: 5.0000 mg | Freq: Once | ORAL | Status: DC | PRN

## 2021-08-20 MED ORDER — LOSARTAN POTASSIUM 50 MG PO TABS
100.0000 mg | ORAL_TABLET | Freq: Every evening | ORAL | Status: DC
  Administered 2021-08-20: 100 mg via ORAL
  Filled 2021-08-20: qty 2

## 2021-08-20 MED ORDER — AMLODIPINE BESYLATE 5 MG PO TABS
2.5000 mg | ORAL_TABLET | Freq: Every evening | ORAL | Status: DC
Start: 2021-08-20 — End: 2021-08-21
  Administered 2021-08-20: 2.5 mg via ORAL
  Filled 2021-08-20: qty 1

## 2021-08-20 MED ORDER — POLYETHYL GLYCOL-PROPYL GLYCOL 0.4-0.3 % OP GEL
1.0000 | Freq: Every day | OPHTHALMIC | Status: DC
Start: 2021-08-20 — End: 2021-08-20

## 2021-08-20 MED ORDER — ACETAMINOPHEN 500 MG PO TABS
1000.0000 mg | ORAL_TABLET | Freq: Once | ORAL | Status: AC
  Administered 2021-08-20: 1000 mg via ORAL
  Filled 2021-08-20: qty 2

## 2021-08-20 MED ORDER — ROCURONIUM BROMIDE 10 MG/ML (PF) SYRINGE
PREFILLED_SYRINGE | INTRAVENOUS | Status: DC | PRN
  Administered 2021-08-20: 100 mg via INTRAVENOUS

## 2021-08-20 MED ORDER — HYDROCHLOROTHIAZIDE 25 MG PO TABS
50.0000 mg | ORAL_TABLET | Freq: Every morning | ORAL | Status: DC
  Administered 2021-08-21: 50 mg via ORAL
  Filled 2021-08-20: qty 2

## 2021-08-20 MED ORDER — DEXAMETHASONE SODIUM PHOSPHATE 10 MG/ML IJ SOLN
INTRAMUSCULAR | Status: DC | PRN
  Administered 2021-08-20: 10 mg via INTRAVENOUS

## 2021-08-20 MED ORDER — SODIUM CHLORIDE 0.9% FLUSH: 3.0000 mL | Freq: Two times a day (BID) | INTRAVENOUS | Status: DC

## 2021-08-20 MED ORDER — THROMBIN 5000 UNITS EX SOLR
CUTANEOUS | Status: AC
  Filled 2021-08-20: qty 5000

## 2021-08-20 MED ORDER — ONDANSETRON HCL 4 MG/2ML IJ SOLN
INTRAMUSCULAR | Status: AC
Start: 2021-08-20 — End: ?
  Filled 2021-08-20: qty 2

## 2021-08-20 MED ORDER — ONDANSETRON HCL 4 MG/2ML IJ SOLN
INTRAMUSCULAR | Status: DC | PRN
  Administered 2021-08-20: 4 mg via INTRAVENOUS

## 2021-08-20 MED ORDER — ROCURONIUM BROMIDE 10 MG/ML (PF) SYRINGE
PREFILLED_SYRINGE | INTRAVENOUS | Status: AC
  Filled 2021-08-20: qty 10

## 2021-08-20 MED ORDER — ACETAMINOPHEN 160 MG/5ML PO SOLN: 325.0000 mg | ORAL | Status: DC | PRN

## 2021-08-20 MED ORDER — POLYVINYL ALCOHOL 1.4 % OP SOLN: 1.0000 [drp] | Freq: Two times a day (BID) | OPHTHALMIC | Status: DC | PRN

## 2021-08-20 MED ORDER — BUPIVACAINE HCL (PF) 0.25 % IJ SOLN
INTRAMUSCULAR | Status: AC
  Filled 2021-08-20: qty 30

## 2021-08-20 MED ORDER — NITROGLYCERIN 0.4 MG SL SUBL
0.4000 mg | SUBLINGUAL_TABLET | SUBLINGUAL | Status: DC | PRN
Start: 2021-08-20 — End: 2021-08-21

## 2021-08-20 MED ORDER — HYDROCODONE-ACETAMINOPHEN 5-325 MG PO TABS: 2.0000 | ORAL_TABLET | ORAL | Status: DC | PRN

## 2021-08-20 MED ORDER — ARTIFICIAL TEARS OPHTHALMIC OINT
1.0000 | TOPICAL_OINTMENT | Freq: Every day | OPHTHALMIC | Status: DC
Start: 2021-08-20 — End: 2021-08-21
  Filled 2021-08-20: qty 3.5

## 2021-08-20 MED ORDER — CYCLOBENZAPRINE HCL 10 MG PO TABS
10.0000 mg | ORAL_TABLET | Freq: Three times a day (TID) | ORAL | Status: DC | PRN
  Administered 2021-08-20: 10 mg via ORAL
  Filled 2021-08-20: qty 1

## 2021-08-20 MED ORDER — PHENYLEPHRINE HCL-NACL 20-0.9 MG/250ML-% IV SOLN
INTRAVENOUS | Status: DC | PRN
  Administered 2021-08-20: 60 ug/min via INTRAVENOUS

## 2021-08-20 MED ORDER — ONDANSETRON HCL 4 MG/2ML IJ SOLN
4.0000 mg | Freq: Four times a day (QID) | INTRAMUSCULAR | Status: DC | PRN
Start: 2021-08-20 — End: 2021-08-21

## 2021-08-20 MED ORDER — MEPERIDINE HCL 25 MG/ML IJ SOLN: 6.2500 mg | INTRAMUSCULAR | Status: DC | PRN

## 2021-08-20 MED ORDER — ACETAMINOPHEN 650 MG RE SUPP: 650.0000 mg | RECTAL | Status: DC | PRN

## 2021-08-20 MED ORDER — ASPIRIN 81 MG PO TBEC
81.0000 mg | DELAYED_RELEASE_TABLET | Freq: Every day | ORAL | Status: DC
  Administered 2021-08-20: 81 mg via ORAL
  Filled 2021-08-20: qty 1

## 2021-08-20 MED ORDER — FENTANYL CITRATE (PF) 250 MCG/5ML IJ SOLN
INTRAMUSCULAR | Status: AC
  Filled 2021-08-20: qty 5

## 2021-08-20 MED ORDER — SUGAMMADEX SODIUM 200 MG/2ML IV SOLN
INTRAVENOUS | Status: DC | PRN
  Administered 2021-08-20: 200 mg via INTRAVENOUS

## 2021-08-20 MED ORDER — LACTATED RINGERS IV SOLN: INTRAVENOUS | Status: DC

## 2021-08-20 MED ORDER — ONDANSETRON HCL 4 MG/2ML IJ SOLN: 4.0000 mg | Freq: Once | INTRAMUSCULAR | Status: DC | PRN

## 2021-08-20 MED ORDER — METOPROLOL SUCCINATE ER 50 MG PO TB24
50.0000 mg | ORAL_TABLET | Freq: Every day | ORAL | Status: DC
  Administered 2021-08-21: 50 mg via ORAL
  Filled 2021-08-20: qty 1

## 2021-08-20 MED ORDER — 0.9 % SODIUM CHLORIDE (POUR BTL) OPTIME
TOPICAL | Status: DC | PRN
  Administered 2021-08-20: 1000 mL

## 2021-08-20 MED ORDER — TAMSULOSIN HCL 0.4 MG PO CAPS
0.8000 mg | ORAL_CAPSULE | Freq: Every day | ORAL | Status: DC
  Administered 2021-08-20: 0.8 mg via ORAL
  Filled 2021-08-20: qty 2

## 2021-08-20 MED ORDER — CEFAZOLIN IN SODIUM CHLORIDE 3-0.9 GM/100ML-% IV SOLN
3.0000 g | INTRAVENOUS | Status: AC
  Administered 2021-08-20: 3 g via INTRAVENOUS
  Filled 2021-08-20: qty 100

## 2021-08-20 MED ORDER — THROMBIN 5000 UNITS EX SOLR
CUTANEOUS | Status: DC | PRN
  Administered 2021-08-20: 10000 [IU] via TOPICAL

## 2021-08-20 MED ORDER — SODIUM CHLORIDE 0.9 % IV SOLN: 250.0000 mL | INTRAVENOUS | Status: DC

## 2021-08-20 MED ORDER — ISOSORBIDE MONONITRATE ER 30 MG PO TB24
15.0000 mg | ORAL_TABLET | Freq: Every evening | ORAL | Status: DC
  Administered 2021-08-20: 15 mg via ORAL
  Filled 2021-08-20: qty 1

## 2021-08-20 MED ORDER — FENTANYL CITRATE (PF) 250 MCG/5ML IJ SOLN
INTRAMUSCULAR | Status: DC | PRN
  Administered 2021-08-20: 50 ug via INTRAVENOUS
  Administered 2021-08-20: 100 ug via INTRAVENOUS
  Administered 2021-08-20 (×2): 50 ug via INTRAVENOUS

## 2021-08-20 MED ORDER — BUPIVACAINE HCL (PF) 0.25 % IJ SOLN
INTRAMUSCULAR | Status: DC | PRN
  Administered 2021-08-20: 10 mL

## 2021-08-20 MED ORDER — PSYLLIUM 95 % PO PACK
1.0000 | PACK | Freq: Every day | ORAL | Status: DC
  Filled 2021-08-20 (×2): qty 1

## 2021-08-20 MED ORDER — AMOXICILLIN 500 MG PO CAPS: 2000.0000 mg | ORAL_CAPSULE | ORAL | Status: DC | PRN

## 2021-08-20 MED ORDER — ALUM & MAG HYDROXIDE-SIMETH 200-200-20 MG/5ML PO SUSP
30.0000 mL | Freq: Four times a day (QID) | ORAL | Status: DC | PRN
  Administered 2021-08-20: 30 mL via ORAL
  Filled 2021-08-20: qty 30

## 2021-08-20 MED ORDER — EPHEDRINE SULFATE-NACL 50-0.9 MG/10ML-% IV SOSY
PREFILLED_SYRINGE | INTRAVENOUS | Status: DC | PRN
  Administered 2021-08-20 (×3): 5 mg via INTRAVENOUS

## 2021-08-20 MED ORDER — DEXAMETHASONE SODIUM PHOSPHATE 10 MG/ML IJ SOLN
INTRAMUSCULAR | Status: AC
  Filled 2021-08-20: qty 1

## 2021-08-20 MED ORDER — ACETAMINOPHEN 325 MG PO TABS
650.0000 mg | ORAL_TABLET | ORAL | Status: DC | PRN
  Administered 2021-08-20 – 2021-08-21 (×2): 650 mg via ORAL
  Filled 2021-08-20 (×2): qty 2

## 2021-08-20 MED ORDER — ACETAMINOPHEN 325 MG PO TABS: 325.0000 mg | ORAL_TABLET | ORAL | Status: DC | PRN

## 2021-08-20 MED ORDER — PANTOPRAZOLE SODIUM 40 MG PO TBEC
40.0000 mg | DELAYED_RELEASE_TABLET | Freq: Every day | ORAL | Status: DC
  Administered 2021-08-20: 40 mg via ORAL
  Filled 2021-08-20: qty 1

## 2021-08-20 MED ORDER — ORAL CARE MOUTH RINSE: 15.0000 mL | Freq: Once | OROMUCOSAL | Status: AC

## 2021-08-20 MED ORDER — LIDOCAINE-EPINEPHRINE 1 %-1:100000 IJ SOLN
INTRAMUSCULAR | Status: AC
  Filled 2021-08-20: qty 1

## 2021-08-20 SURGICAL SUPPLY — 57 items
ADH SKN CLS APL DERMABOND .7 (GAUZE/BANDAGES/DRESSINGS) ×1
APL SKNCLS STERI-STRIP NONHPOA (GAUZE/BANDAGES/DRESSINGS) ×1
BAG COUNTER SPONGE SURGICOUNT (BAG) ×3 IMPLANT
BAG SPNG CNTER NS LX DISP (BAG) ×2
BAND INSRT 18 STRL LF DISP RB (MISCELLANEOUS) ×2
BAND RUBBER #18 3X1/16 STRL (MISCELLANEOUS) ×4 IMPLANT
BENZOIN TINCTURE PRP APPL 2/3 (GAUZE/BANDAGES/DRESSINGS) ×2 IMPLANT
BLADE SURG 11 STRL SS (BLADE) ×2 IMPLANT
BUR CUTTER 7.0 ROUND (BURR) ×2 IMPLANT
BUR MATCHSTICK NEURO 3.0 LAGG (BURR) ×2 IMPLANT
CANISTER SUCT 3000ML PPV (MISCELLANEOUS) ×2 IMPLANT
CARTRIDGE OIL MAESTRO DRILL (MISCELLANEOUS) ×1 IMPLANT
DERMABOND ADVANCED (GAUZE/BANDAGES/DRESSINGS) ×1
DERMABOND ADVANCED .7 DNX12 (GAUZE/BANDAGES/DRESSINGS) ×1 IMPLANT
DIFFUSER DRILL AIR PNEUMATIC (MISCELLANEOUS) ×2 IMPLANT
DRAPE HALF SHEET 40X57 (DRAPES) ×1 IMPLANT
DRAPE LAPAROTOMY 100X72X124 (DRAPES) ×2 IMPLANT
DRAPE MICROSCOPE LEICA (MISCELLANEOUS) ×2 IMPLANT
DRAPE SURG 17X23 STRL (DRAPES) ×2 IMPLANT
DRSG OPSITE POSTOP 4X6 (GAUZE/BANDAGES/DRESSINGS) ×1 IMPLANT
DURAPREP 26ML APPLICATOR (WOUND CARE) ×2 IMPLANT
ELECT REM PT RETURN 9FT ADLT (ELECTROSURGICAL) ×2
ELECTRODE REM PT RTRN 9FT ADLT (ELECTROSURGICAL) ×1 IMPLANT
GAUZE SPONGE 4X4 12PLY STRL (GAUZE/BANDAGES/DRESSINGS) ×1 IMPLANT
GLOVE BIO SURGEON STRL SZ7 (GLOVE) ×2 IMPLANT
GLOVE BIO SURGEON STRL SZ8 (GLOVE) ×3 IMPLANT
GLOVE BIOGEL PI IND STRL 6.5 (GLOVE) IMPLANT
GLOVE BIOGEL PI IND STRL 7.0 (GLOVE) IMPLANT
GLOVE BIOGEL PI IND STRL 7.5 (GLOVE) IMPLANT
GLOVE BIOGEL PI INDICATOR 6.5 (GLOVE) ×2
GLOVE BIOGEL PI INDICATOR 7.0 (GLOVE) ×2
GLOVE BIOGEL PI INDICATOR 7.5 (GLOVE) ×3
GLOVE INDICATOR 8.5 STRL (GLOVE) ×3 IMPLANT
GLOVE SURG SS PI 6.5 STRL IVOR (GLOVE) ×3 IMPLANT
GLOVE SURG SS PI 7.0 STRL IVOR (GLOVE) ×2 IMPLANT
GOWN STRL REUS W/ TWL LRG LVL3 (GOWN DISPOSABLE) ×1 IMPLANT
GOWN STRL REUS W/ TWL XL LVL3 (GOWN DISPOSABLE) ×2 IMPLANT
GOWN STRL REUS W/TWL 2XL LVL3 (GOWN DISPOSABLE) IMPLANT
GOWN STRL REUS W/TWL LRG LVL3 (GOWN DISPOSABLE) ×6
GOWN STRL REUS W/TWL XL LVL3 (GOWN DISPOSABLE) ×4
KIT BASIN OR (CUSTOM PROCEDURE TRAY) ×2 IMPLANT
KIT TURNOVER KIT B (KITS) ×2 IMPLANT
NDL SPNL 22GX3.5 QUINCKE BK (NEEDLE) ×1 IMPLANT
NEEDLE HYPO 22GX1.5 SAFETY (NEEDLE) ×2 IMPLANT
NEEDLE SPNL 22GX3.5 QUINCKE BK (NEEDLE) ×2 IMPLANT
NS IRRIG 1000ML POUR BTL (IV SOLUTION) ×2 IMPLANT
OIL CARTRIDGE MAESTRO DRILL (MISCELLANEOUS) ×2
PACK LAMINECTOMY NEURO (CUSTOM PROCEDURE TRAY) ×2 IMPLANT
SPONGE SURGIFOAM ABS GEL SZ50 (HEMOSTASIS) ×2 IMPLANT
STRIP CLOSURE SKIN 1/2X4 (GAUZE/BANDAGES/DRESSINGS) ×2 IMPLANT
SUT VIC AB 0 CT1 18XCR BRD8 (SUTURE) ×1 IMPLANT
SUT VIC AB 0 CT1 8-18 (SUTURE) ×2
SUT VIC AB 2-0 CT1 18 (SUTURE) ×2 IMPLANT
SUT VICRYL 4-0 PS2 18IN ABS (SUTURE) ×2 IMPLANT
TOWEL GREEN STERILE (TOWEL DISPOSABLE) ×2 IMPLANT
TOWEL GREEN STERILE FF (TOWEL DISPOSABLE) ×2 IMPLANT
WATER STERILE IRR 1000ML POUR (IV SOLUTION) ×2 IMPLANT

## 2021-08-20 NOTE — Transfer of Care (Signed)
Immediate Anesthesia Transfer of Care Note  Patient: Justin Coleman  Procedure(s) Performed: Right Lumbar One-Two, Lumbar Two-Three Microdiscectomy (Right: Spine Lumbar)  Patient Location: PACU  Anesthesia Type:General  Level of Consciousness: drowsy and patient cooperative  Airway & Oxygen Therapy: Patient Spontanous Breathing  Post-op Assessment: Report given to RN, Post -op Vital signs reviewed and stable and Patient moving all extremities  Post vital signs: Reviewed and stable  Last Vitals:  Vitals Value Taken Time  BP 134/54 08/20/21 1548  Temp    Pulse 69 08/20/21 1549  Resp 11 08/20/21 1549  SpO2 95 % 08/20/21 1549  Vitals shown include unvalidated device data.  Last Pain: There were no vitals filed for this visit.       Complications: No notable events documented.

## 2021-08-20 NOTE — Progress Notes (Signed)
Called patient, NA, LMAM

## 2021-08-20 NOTE — Anesthesia Postprocedure Evaluation (Signed)
Anesthesia Post Note  Patient: Justin Coleman  Procedure(s) Performed: Right Lumbar One-Two, Lumbar Two-Three Microdiscectomy (Right: Spine Lumbar)     Patient location during evaluation: PACU Anesthesia Type: General Level of consciousness: awake and alert Pain management: pain level controlled Vital Signs Assessment: post-procedure vital signs reviewed and stable Respiratory status: spontaneous breathing, nonlabored ventilation, respiratory function stable and patient connected to nasal cannula oxygen Cardiovascular status: blood pressure returned to baseline and stable Postop Assessment: no apparent nausea or vomiting Anesthetic complications: no   No notable events documented.  Last Vitals:  Vitals:   08/20/21 1620 08/20/21 1635  BP: 133/61 126/60  Pulse: (!) 58 (!) 59  Resp: 16 17  Temp:    SpO2: 95% 94%    Last Pain:  Vitals:   08/20/21 1620  PainSc: 0-No pain                 Armani Gawlik

## 2021-08-20 NOTE — Op Note (Signed)
Preoperative diagnosis: Right L2 radiculopathy from herniated disc L1-L2 right and lateral recess stenosis L2-3  Postoperative diagnosis: Same  Procedure: #1 lumbar laminectomy microdiscectomy L1-2 on the right microdissection of the right L1 II nerve root and microscopic discectomy  2.  Decompressive laminotomy L2-3 on the right with partial medial facetectomy and foraminotomy of the right L3 nerve root  Surgeon: Jillyn Hidden Aditi Rovira  Assistant: Julien Girt  Anesthesia: General  EBL: Minimal  HPI: 88 year old gentleman progressive worsening back and right hip and leg pain rating down L2-L3 nerve root pattern work-up revealed large disc herniation L1-2 on the right and moderate lateral recess stenosis L2-3 on the right due to patient's progression of clinical syndrome imaging findings failed conservative treatment I recommended decompressive laminectomy L2-3 and limited to microdiscectomy L1-L2.  I extensively went over the risks and benefits of the operation with him as well as perioperative course expectations of outcome and alternatives of surgery and he understood and agreed to proceed forward.  Operative procedure: Patient was brought into the OR was induced under general anesthesia and positioned prone Wilson frame his back was prepped and draped in routine sterile fashion.  Preoperative x-ray localized the appropriate level so after infiltration of 10 cc lidocaine with epi midline incision was made and Bovie that cautery was used to take down the subcutaneous tissue and subperiosteal dissection was carried lamina of L1-3 on the right.  Intraoperative x-ray confirmed indication appropriate level.  So in the inferior aspect of lamina of L1 medial facet complex superior aspect and virtually all the lamina of L2 was drilled down with a high-speed drill.  Laminotomy was begun with a 3 mm Kerrison punch ligament flavum was identified noting markedly hypertrophied this was all removed in piecemeal fashion  partial medial facetectomy was performed then under microscopic illumination the facetectomy was extended foraminotomies of the L2 nerve root was achieved extending inferiorly to the inferior aspect the L2 pedicle and L2-3 disc base.  Then identified the disc base was a very large disc herniation still partially contained with the ligament ligament was incised several large free fragments of disc were removed from the disc base as well as inferior and medial to the L2 nerve root on the right.  After all the fragments were removed the disc base was cleaned out there was no further stenosis at L1-L2.  The thecal sac easily excepted a coronary dilator medially and inferiorly out the foramen and superiorly.  Tracking inferiorly foraminotomies performed of the distal L2 nerve root L2-3 disc base and superior aspect of the III nerve root and this was all decompressed.  At the end of decompression was no further stenosis the wound was copiously irrigated meticulous hemostasis was maintained Gelfoam was overlaid top of the dura the muscle fascia approximate layers with interrupted Vicryl skin was closed running 4 subcuticular Dermabond benzoin Steri-Strips and a sterile dressing was applied patient recovery in stable condition.  At the end the case all needle count sponge counts were correct.

## 2021-08-20 NOTE — Anesthesia Procedure Notes (Signed)
Procedure Name: Intubation Date/Time: 08/20/2021 1:33 PM  Performed by: Leonor Liv, CRNAPre-anesthesia Checklist: Patient identified, Emergency Drugs available, Suction available and Patient being monitored Patient Re-evaluated:Patient Re-evaluated prior to induction Oxygen Delivery Method: Circle System Utilized Preoxygenation: Pre-oxygenation with 100% oxygen Induction Type: IV induction Ventilation: Mask ventilation without difficulty Laryngoscope Size: Mac and 4 Grade View: Grade II Tube type: Oral Tube size: 8.0 mm Number of attempts: 1 Airway Equipment and Method: Stylet and Oral airway Placement Confirmation: ETT inserted through vocal cords under direct vision, positive ETCO2 and breath sounds checked- equal and bilateral Secured at: 23 cm Tube secured with: Tape Dental Injury: Teeth and Oropharynx as per pre-operative assessment

## 2021-08-20 NOTE — H&P (Signed)
Justin Coleman is an 88 y.o. male.   Chief Complaint: Back and right hip and leg pain HPI: 88 year old gentleman with longstanding back and right hip and leg pain work-up has revealed large disc herniation L1-L2 as well as lateral recess stenosis at L2-3.  Due to patient's weakness proximal right lower extremity and imaging I recommended decompressive laminectomy microdiscectomy L1-L2 on the right and decompressive laminotomy L2-3 on the right I extensively reviewed the risks and benefits of the operation with him as well as perioperative course expectations of outcome and alternatives of surgery and he understands and agrees to proceed forward.  Past Medical History:  Diagnosis Date   Arthritis    BPH (benign prostatic hyperplasia)    Coronary artery disease    non obstructive   Dyspnea    with exertion   GERD (gastroesophageal reflux disease)    Hypertension    Lumbago    Sleep apnea     Past Surgical History:  Procedure Laterality Date   APPENDECTOMY     BACK SURGERY  1993   CARDIAC CATHETERIZATION     CERVICAL FUSION     age 50    COLONOSCOPY     EYE SURGERY Bilateral    cataract   LEFT HEART CATH AND CORONARY ANGIOGRAPHY N/A 06/27/2019   Procedure: LEFT HEART CATH AND CORONARY ANGIOGRAPHY;  Surgeon: Yates Decamp, MD;  [facility];  Service: Cardiovascular;  Laterality: N/A;   LUMBAR LAMINECTOMY/DECOMPRESSION MICRODISCECTOMY Left 10/25/2019   Procedure: Left Extraforaminal Lumbar Three-Four Microdiscectomy;  Surgeon: Donalee Citrin, MD;  Location: [facility] OR;  Service: Neurosurgery;  Laterality: Left;   testicular growth     TONSILLECTOMY     TOTAL KNEE ARTHROPLASTY Bilateral 2006/2007    Family History  Problem Relation Age of Onset   Hypertension Mother    Social History:  reports that he quit smoking about 56 years ago. His smoking use included cigarettes. He has a 20.00 pack-year smoking history. He has never used smokeless tobacco. He reports that he does not  drink alcohol and does not use drugs.  Allergies:  Allergies  Allergen Reactions   Oxycodone Nausea Only    Abdominal pain    Medications Prior to Admission  Medication Sig Dispense Refill   amLODipine (NORVASC) 5 MG tablet Take 2.5 mg by mouth every evening.     atorvastatin (LIPITOR) 10 MG tablet TAKE 1 TABLET BY MOUTH EACH NIGHT AT BEDTIME (Patient taking differently: Take 10 mg by mouth in the morning.) 90 tablet 1   hydrochlorothiazide (HYDRODIURIL) 50 MG tablet Take 50 mg by mouth in the morning.     hypromellose (SYSTANE OVERNIGHT THERAPY) 0.3 % GEL ophthalmic ointment Place 1 Application into both eyes at bedtime.     isosorbide mononitrate (IMDUR) 30 MG 24 hr tablet Take 15 mg by mouth every evening.      losartan (COZAAR) 100 MG tablet Take 100 mg by mouth every evening.      metoprolol succinate (TOPROL-XL) 50 MG 24 hr tablet TAKE 1 TABLET BY MOUTH EVERY MORNING. HOLD IF SYSTOLIC BLOOD PRESSURE IS LESS THAN 100 MMHG OR HEART RATE IS LESS THAN60 BPM 90 tablet 0   nitroGLYCERIN (NITROSTAT) 0.4 MG SL tablet Place 0.4 mg under the tongue every 5 (five) minutes x 3 doses as needed for chest pain.      Polyethyl Glycol-Propyl Glycol (SYSTANE ULTRA) 0.4-0.3 % SOLN Place 1-2 drops into both eyes 2 (two) times daily as needed (dry/irritated eyes.).  Polyethyl Glycol-Propyl Glycol (SYSTANE) 0.4-0.3 % GEL ophthalmic gel Place 1 Application into both eyes at bedtime.     psyllium (METAMUCIL) 58.6 % powder Take 1 packet by mouth daily as needed (Constipation).      spironolactone (ALDACTONE) 25 MG tablet Take 1 tablet (25 mg total) by mouth every morning. 30 tablet 2   tamsulosin (FLOMAX) 0.4 MG CAPS capsule Take 0.8 mg by mouth daily after supper. 30 minutes after supper     amoxicillin (AMOXIL) 500 MG capsule Take 2,000 mg by mouth See admin instructions. TAKE 4 CAPSULES (2000 MG) BY MOUTH 1 HOUR PRIOR TO DENTAL APPOINTMENTS.     aspirin EC 81 MG tablet Take 81 mg by mouth daily.       Results for orders placed or performed during the hospital encounter of 08/20/21 (from the past 48 hour(s))  CBC     Status: None   Collection Time: 08/20/21 10:39 AM  Result Value Ref Range   WBC 5.1 4.0 - 10.5 K/uL   RBC 4.32 4.22 - 5.81 MIL/uL   Hemoglobin 14.2 13.0 - 17.0 g/dL   HCT 16.1 09.6 - 04.5 %   MCV 94.9 80.0 - 100.0 fL   MCH 32.9 26.0 - 34.0 pg   MCHC 34.6 30.0 - 36.0 g/dL   RDW 40.9 81.1 - 91.4 %   Platelets 188 150 - 400 K/uL   nRBC 0.0 0.0 - 0.2 %    Comment: Performed at [facility], [address], [place], [place] [address]  Basic metabolic panel     Status: Abnormal   Collection Time: 08/20/21 10:51 AM  Result Value Ref Range   Sodium 138 135 - 145 mmol/L   Potassium 4.0 3.5 - 5.1 mmol/L   Chloride 105 98 - 111 mmol/L   CO2 25 22 - 32 mmol/L   Glucose, Bld 117 (H) 70 - 99 mg/dL    Comment: Glucose reference range applies only to samples taken after fasting for at least 8 hours.   BUN 24 (H) 8 - 23 mg/dL   Creatinine, Ser 6.21 0.61 - 1.24 mg/dL   Calcium 9.4 8.9 - 30.8 mg/dL   GFR, Estimated >65 >78 mL/min    Comment: (NOTE) Calculated using the CKD-EPI Creatinine Equation (2021)    Anion gap 8 5 - 15    Comment: Performed at [facility], [address], [place], [place] [address]   No results found.  Review of Systems  Musculoskeletal:  Positive for back pain.  Neurological:  Positive for numbness.    Blood pressure (!) 136/51, pulse 62, temperature (!) 97.5 F (36.4 C), resp. rate 18, height 5\' 10"  (1.778 m), weight 131.5 kg, SpO2 95 %. Physical Exam HENT:     Head: Normocephalic.     Right Ear: Tympanic membrane normal.     Nose: Nose normal.  Eyes:     Pupils: Pupils are equal, round, and reactive to light.  Cardiovascular:     Rate and Rhythm: Normal rate.     Pulses: Normal pulses.  Pulmonary:     Effort: Pulmonary effort is normal.  Abdominal:     General: Abdomen is flat.  Musculoskeletal:        General: Normal  range of motion.  Skin:    General: Skin is warm.  Neurological:     Mental Status: He is alert.     Comments: Strength is 5 out of 5 in his left lower extremity iliopsoas, quads,  hamstrings, gastrocs, tibialis, and EHL.  He is got some proximal right leg weakness with hip flexor.  This measures about 4 to 4+ out of 5 distally is 5 out of 5.      Assessment/Plan 88 year old presents for decompressive laminotomies L1-L2 and L2-3 with microdiscectomy on the right at L1-L2  Mariam Dollar, MD 08/20/2021, 12:32 PM

## 2021-08-21 ENCOUNTER — Encounter (HOSPITAL_COMMUNITY): Payer: Self-pay | Admitting: Neurosurgery

## 2021-08-21 DIAGNOSIS — M5116 Intervertebral disc disorders with radiculopathy, lumbar region: Secondary | ICD-10-CM | POA: Diagnosis not present

## 2021-08-21 MED ORDER — HYDROCODONE-ACETAMINOPHEN 5-325 MG PO TABS: 2.0000 | ORAL_TABLET | ORAL | 0 refills | Status: AC | PRN

## 2021-08-21 MED ORDER — CYCLOBENZAPRINE HCL 10 MG PO TABS
10.0000 mg | ORAL_TABLET | Freq: Three times a day (TID) | ORAL | 0 refills | Status: AC | PRN
Start: 2021-08-21 — End: ?

## 2021-08-21 NOTE — Plan of Care (Signed)

## 2021-08-21 NOTE — Progress Notes (Signed)
Called pt, no answer. Left vm requesting.

## 2021-08-21 NOTE — Discharge Instructions (Signed)
Wound Care Keep the incision clean and dry remove the outer dressing in 2 days, leave the Steri-Strips intact. Wrap with Saran wrap for showers only Do not put any creams, lotions, or ointments on incision. Leave steri-strips on back.  They will fall off by themselves.  Activity Walk each and every day, increasing distance each day. No lifting greater than 5 lbs.  No lifting no bending no twisting no driving or riding a car unless coming back and forth to see me. If provided with back brace, wear when out of bed.  It is not necessary to wear brace in bed.  Diet Resume your normal diet.    Call Your Doctor If Any of These Occur Redness, drainage, or swelling at the wound.  Temperature greater than 101 degrees. Severe pain not relieved by pain medication. Incision starts to come apart.  Follow Up Appt Call today for appointment in 1-2 weeks ([phone]) or for problems.  If you have any hardware placed in your spine, you will need an x-ray before your appointment.   

## 2021-08-21 NOTE — Progress Notes (Signed)
Patient transported to his vehicle via wheelchair by volunteer for discharge home; in no acute distress nor complaints of pain nor discomfort; moves all extremities well; incision on his lower back with honeycomb dressing and is clean, dry and intact; room was checked for all his belongings; discharge instructions concerning his medications, incision care, follow up appointment and when to call the doctor as needed were all discussed with patient and his wife by RN and both expressed understanding on the instructions given.

## 2022-02-25 ENCOUNTER — Ambulatory Visit: Payer: Medicare PPO | Admitting: Cardiology

## 2022-03-05 ENCOUNTER — Encounter: Payer: Self-pay | Admitting: Cardiology

## 2022-03-05 ENCOUNTER — Ambulatory Visit: Payer: Medicare PPO | Admitting: Cardiology

## 2022-03-05 VITALS — BP 121/61 | HR 76 | Resp 16 | Ht 70.0 in | Wt 292.2 lb

## 2022-03-05 DIAGNOSIS — I2089 Other forms of angina pectoris: Secondary | ICD-10-CM

## 2022-03-05 DIAGNOSIS — I10 Essential (primary) hypertension: Secondary | ICD-10-CM

## 2022-03-05 DIAGNOSIS — R0609 Other forms of dyspnea: Secondary | ICD-10-CM

## 2022-03-05 NOTE — Progress Notes (Signed)
Justin Coleman Date of Birth: May 13, 1932 MRN: [id number] Primary Care Provider:Elkins, Curly Rim, MD Former Cardiology Providers: Dr. Donnie Aho  Primary Cardiologist: Yates Decamp, MD, [facility] (established care 06/19/2019)  Date: 03/05/22 Last Office Visit: 05/31/2020  Chief Complaint  Patient presents with   Hypertension   Follow-up    6 months   HPI  Justin Coleman is a 89 y.o. male with microvascular angina, hypertension, sleep apnea, former smoker, advanced age, morbid obesity.  His cardiac catheterization on 06/27/2019 revealed no significant coronary artery disease but slow filling suggestive of microvascular disease.  Since being on aggressive medical therapy, he has not had any recurrence of angina pectoris and he remains asymptomatic.    He is compliant with the CPAP machine.  He has no specific complaints except he has developed severe plantar fasciitis.  ALLERGIES: Allergies  Allergen Reactions   Oxycodone Nausea Only    Abdominal pain    PAST MEDICAL HISTORY: Past Medical History:  Diagnosis Date   Arthritis    BPH (benign prostatic hyperplasia)    Coronary artery disease    non obstructive   Dyspnea    with exertion   GERD (gastroesophageal reflux disease)    Hyperlipidemia    Hypertension    Lumbago    Sleep apnea    REVIEW OF SYSTEMS: Review of Systems  Cardiovascular:  Positive for dyspnea on exertion and leg swelling. Negative for chest pain.    PHYSICAL EXAM:    03/05/2022   11:33 AM 08/21/2021    7:22 AM 08/21/2021    3:18 AM  Vitals with BMI  Height 5\' 10"     Weight 292 lbs 3 oz    BMI 41.93    Systolic 121 143  Diastolic 61 68 66  Pulse 76 85 75   Physical Exam Neck:     Vascular: No JVD.  Cardiovascular:     Rate and Rhythm: Normal rate and regular rhythm.     Pulses: Intact distal pulses.          Dorsalis pedis pulses are 1+ on the right side and 1+ on the left side.       Posterior tibial pulses are 1+ on the right side and  1+ on the left side.     Heart sounds: Normal heart sounds. No murmur heard.    No gallop.  Pulmonary:     Effort: Pulmonary effort is normal.     Breath sounds: Normal breath sounds.  Abdominal:     General: Abdomen is protuberant. Bowel sounds are normal.     Palpations: Abdomen is soft.  Musculoskeletal:     Right lower leg: Edema (2+ below knee) present.     Left lower leg: Edema (2+ below knee) present.    CARDIAC DATABASE:  EKG:  EKG 03/05/2022: Sinus rhythm.  AV block at rate of 68 bpm, poor R progression, cannot exclude anteroseptal infarct old.  Borderline low voltage complexes.  Compared to 08/05/2021, no significant change.  Echocardiogram: 06/22/2019: LVEF 60-65%, moderate LVH, indeterminate diastolic filling pattern, elevated left atrial pressure, aortic valve sclerosis without stenosis, mild MR, mild TR.  Stress Testing: None  Heart Catheterization: 06/27/19:  Normal LV systolic function, normal EDP. Right coronary artery with a mid 20% stenosis.  Significant improvement in flow with nitroglycerin administration suggestive of mild coronary spasm and microvascular angina. Left main normal. Circumflex moderate size, normal. Ramus intermediate very small, normal. LAD large, smooth and normal, gives origin to a small to  moderate-sized D1 which is a ostial 60% stenosis which is 1.5 mm in diameter.  Recommendation: Chest pain symptoms could be related to microvascular angina and also possibly coronary spasm.  Patient will be discharged home on amlodipine in addition to the current medical regimen.  45 mL contrast utilized.  LABORATORY DATA:     Latest Ref Rng & Units 08/20/2021   10:51 AM 08/15/2021   10:40 AM 07/15/2020    9:54 AM  CMP  Glucose 70 - 99 mg/dL 117  115  102   BUN 8 - 23 mg/dL 24  26  20    Creatinine 0.61 - 1.24 mg/dL 1.11  1.07  0.80   Sodium 135 - 145 mmol/L 138  140  138   Potassium 3.5 - 5.1 mmol/L 4.0  4.6  4.2   Chloride 98 - 111 mmol/L 105  102   101   CO2 22 - 32 mmol/L 25  22  23    Calcium 8.9 - 10.3 mg/dL 9.4  9.7  9.2   Total Protein 6.0 - 8.5 g/dL   6.4   Total Bilirubin 0.0 - 1.2 mg/dL   0.4   Alkaline Phos 44 - 121 IU/L   61   AST 0 - 40 IU/L   17   ALT 0 - 44 IU/L   15       Latest Ref Rng & Units 08/20/2021   10:39 AM 10/24/2019   11:01 AM 06/27/2019   10:34 AM  CBC  WBC 4.0 - 10.5 K/uL 5.1  8.0  5.2   Hemoglobin 13.0 - 17.0 g/dL 14.2  14.0  14.1   Hematocrit 39.0 - 52.0 % 41.0  42.9  42.0   Platelets 150 - 400 K/uL 188  216  216    Lipid Panel     Component Value Date/Time   CHOL 104 07/15/2020 0954   TRIG 101 07/15/2020 0954   HDL 40 07/15/2020 0954   LDLCALC 45 07/15/2020 0954   LDLDIRECT 44 07/15/2020 0954   HEMOGLOBIN A1C Lab Results  Component Value Date   HGBA1C 6.0 (H) 08/15/2021   MPG 128 10/24/2019   TSH Recent Labs    08/15/21 1040  TSH 4.800*    External Labs:  Cholesterol, total 104.000 m 07/15/2020 HDL 40.000 mg 07/15/2020 LDL 44.000 MG 07/15/2020 Triglycerides 101.000 m 07/15/2020  Hemoglobin 14.000 g/d 10/24/2019  Creatinine, Serum 0.800 mg/ 07/15/2020  Potassium 4.200 mm 07/15/2020 ALT (SGPT) 15.000 IU/ 07/15/2020   FINAL MEDICATION LIST END OF ENCOUNTER:   Current Outpatient Medications:    amLODipine (NORVASC) 5 MG tablet, Take 2.5 mg by mouth every evening., Disp: , Rfl:    amoxicillin (AMOXIL) 500 MG capsule, Take 2,000 mg by mouth See admin instructions. TAKE 4 CAPSULES (2000 MG) BY MOUTH 1 HOUR PRIOR TO DENTAL APPOINTMENTS., Disp: , Rfl:    aspirin EC 81 MG tablet, Take 81 mg by mouth daily., Disp: , Rfl:    atorvastatin (LIPITOR) 10 MG tablet, TAKE 1 TABLET BY MOUTH EACH NIGHT AT BEDTIME (Patient taking differently: Take 10 mg by mouth in the morning.), Disp: 90 tablet, Rfl: 1   hydrochlorothiazide (HYDRODIURIL) 50 MG tablet, Take 50 mg by mouth in the morning., Disp: , Rfl:    hypromellose (SYSTANE OVERNIGHT THERAPY) 0.3 % GEL ophthalmic ointment, Place 1 Application into  both eyes at bedtime., Disp: , Rfl:    isosorbide mononitrate (IMDUR) 30 MG 24 hr tablet, Take 15 mg by mouth every evening. , Disp: ,  Rfl:    losartan (COZAAR) 100 MG tablet, Take 100 mg by mouth every evening. , Disp: , Rfl:    metoprolol succinate (TOPROL-XL) 50 MG 24 hr tablet, TAKE 1 TABLET BY MOUTH EVERY MORNING. HOLD IF SYSTOLIC BLOOD PRESSURE IS LESS THAN 100 MMHG OR HEART RATE IS LESS THAN60 BPM, Disp: 90 tablet, Rfl: 0   nitroGLYCERIN (NITROSTAT) 0.4 MG SL tablet, Place 0.4 mg under the tongue every 5 (five) minutes x 3 doses as needed for chest pain. , Disp: , Rfl:    Polyethyl Glycol-Propyl Glycol (SYSTANE ULTRA) 0.4-0.3 % SOLN, Place 1-2 drops into both eyes 2 (two) times daily as needed (dry/irritated eyes.)., Disp: , Rfl:    Polyethyl Glycol-Propyl Glycol (SYSTANE) 0.4-0.3 % GEL ophthalmic gel, Place 1 Application into both eyes at bedtime., Disp: , Rfl:    psyllium (METAMUCIL) 58.6 % powder, Take 1 packet by mouth daily as needed (Constipation). , Disp: , Rfl:    spironolactone (ALDACTONE) 25 MG tablet, Take 1 tablet (25 mg total) by mouth every morning., Disp: 30 tablet, Rfl: 2   tamsulosin (FLOMAX) 0.4 MG CAPS capsule, Take 0.8 mg by mouth daily after supper. 30 minutes after supper, Disp: , Rfl:    cyclobenzaprine (FLEXERIL) 10 MG tablet, Take 1 tablet (10 mg total) by mouth 3 (three) times daily as needed for muscle spasms., Disp: 30 tablet, Rfl: 0   HYDROcodone-acetaminophen (NORCO/VICODIN) 5-325 MG tablet, Take 2 tablets by mouth every 4 (four) hours as needed for severe pain ((score 7 to 10))., Disp: 30 tablet, Rfl: 0  IMPRESSION:    ICD-10-CM   1. Essential hypertension  I10 EKG 12-Lead    2. Atherosclerosis of native coronary artery of native heart without angina pectoris  I25.10     3. Class 3 severe obesity due to excess calories without serious comorbidity with body mass index (BMI) of 40.0 to 44.9 in adult (HCC)  E66.01    Z68.41     4. Dyspnea on exertion  R06.09        Orders Placed This Encounter  Procedures   EKG 12-Lead   No orders of the defined types were placed in this encounter.  RECOMMENDATIONS: Justin Coleman is a 89 y.o. male with microvascular angina, hypertension, sleep apnea, former smoker, advanced age, morbid obesity.  His cardiac catheterization on 06/27/2019 revealed no significant coronary artery disease but slow filling suggestive of microvascular disease.  Since being on aggressive medical therapy, he has not had any recurrence of angina pectoris and he remains asymptomatic.   1. Essential hypertension Blood pressure under excellent control with present medical regimen.  He is on Imdur, hydrochlorothiazide, losartan and metoprolol.  2. Dyspnea on exertion This is chronic, no clinical evidence of heart failure, bilateral leg edema is chronic from venous stasis.  Obesity and physical deconditioning contributing mildly to dyspnea on exertion.  3. Microvascular angina He has not had any further episodes of angina since being on appropriate medical therapy and vasodilator therapy.  We could certainly discontinue isosorbide mononitrate.  He has no significant coronary disease.  4. Class 3 severe obesity due to excess calories without serious comorbidity with body mass index (BMI) of 40.0 to 44.9 in adult [facility]) Patient has hyperglycemia and morbid obesity and microvascular disease with microvascular angina.  He would benefit greatly from weight loss therapy, Wegovy or Ozempic would be excellent choice in management of cardiovascular risk factors along with helping weight loss.  Weight loss counseling was performed by me today,  his wife is present, I have discussed various ways in which she could reduce calories, slow eating, taking a break in between eating (finishing half the meal and giving 10-minute break).  Overall stable from cardiac standpoint, not much for me to contribute, I will see him back on a as needed basis.  25-minute  office visit encounter.   Yates Decamp, MD, [facility] 03/05/2022, 12:09 PM Office: [phone] Fax: [phone]: [phone]

## 2022-12-04 ENCOUNTER — Other Ambulatory Visit: Payer: Self-pay | Admitting: Neurosurgery

## 2022-12-04 DIAGNOSIS — M5440 Lumbago with sciatica, unspecified side: Secondary | ICD-10-CM

## 2022-12-31 ENCOUNTER — Other Ambulatory Visit: Payer: Medicare PPO

## 2023-01-22 ENCOUNTER — Other Ambulatory Visit: Payer: Medicare PPO
